# Patient Record
Sex: Female | Born: 1960 | Race: White | Hispanic: No | Marital: Married | Smoking: Never smoker
Health system: Southern US, Community
[De-identification: ages and names within clinical notes are randomized; demographics above are authoritative.]

## PROBLEM LIST (undated history)

## (undated) DIAGNOSIS — F419 Anxiety disorder, unspecified: Secondary | ICD-10-CM

## (undated) DIAGNOSIS — N209 Urinary calculus, unspecified: Secondary | ICD-10-CM

## (undated) DIAGNOSIS — T8859XA Other complications of anesthesia, initial encounter: Secondary | ICD-10-CM

## (undated) DIAGNOSIS — E7800 Pure hypercholesterolemia, unspecified: Secondary | ICD-10-CM

## (undated) DIAGNOSIS — N200 Calculus of kidney: Secondary | ICD-10-CM

## (undated) DIAGNOSIS — I10 Essential (primary) hypertension: Secondary | ICD-10-CM

## (undated) DIAGNOSIS — G4733 Obstructive sleep apnea (adult) (pediatric): Secondary | ICD-10-CM

## (undated) DIAGNOSIS — G8929 Other chronic pain: Secondary | ICD-10-CM

## (undated) DIAGNOSIS — T4145XA Adverse effect of unspecified anesthetic, initial encounter: Secondary | ICD-10-CM

## (undated) DIAGNOSIS — K219 Gastro-esophageal reflux disease without esophagitis: Secondary | ICD-10-CM

## (undated) DIAGNOSIS — M1990 Unspecified osteoarthritis, unspecified site: Secondary | ICD-10-CM

## (undated) DIAGNOSIS — N159 Renal tubulo-interstitial disease, unspecified: Secondary | ICD-10-CM

## (undated) DIAGNOSIS — F32A Depression, unspecified: Secondary | ICD-10-CM

## (undated) DIAGNOSIS — F329 Major depressive disorder, single episode, unspecified: Secondary | ICD-10-CM

## (undated) DIAGNOSIS — M549 Dorsalgia, unspecified: Secondary | ICD-10-CM

## (undated) HISTORY — PX: ELBOW SURGERY: SHX618

## (undated) HISTORY — DX: Unspecified osteoarthritis, unspecified site: M19.90

## (undated) HISTORY — DX: Other chronic pain: G89.29

## (undated) HISTORY — DX: Anxiety disorder, unspecified: F41.9

## (undated) HISTORY — PX: LAPAROSCOPIC GASTRIC BANDING: SHX1100

## (undated) HISTORY — DX: Obstructive sleep apnea (adult) (pediatric): G47.33

## (undated) HISTORY — PX: LITHOTRIPSY: SUR834

## (undated) HISTORY — DX: Urinary calculus, unspecified: N20.9

## (undated) HISTORY — PX: KNEE SURGERY: SHX244

## (undated) HISTORY — DX: Calculus of kidney: N20.0

## (undated) HISTORY — DX: Dorsalgia, unspecified: M54.9

## (undated) HISTORY — PX: ABDOMINAL HYSTERECTOMY: SHX81

---
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
## 2010-01-29 ENCOUNTER — Emergency Department (HOSPITAL_COMMUNITY): Admission: EM | Admit: 2010-01-29 | Discharge: 2010-01-29 | Payer: Self-pay | Admitting: Emergency Medicine

## 2010-10-03 ENCOUNTER — Emergency Department (HOSPITAL_COMMUNITY)
Admission: EM | Admit: 2010-10-03 | Discharge: 2010-10-03 | Disposition: A | Discharge status: Home / Self Care | Payer: Managed Care, Other (non HMO) | Attending: Emergency Medicine | Admitting: Emergency Medicine

## 2010-10-03 ENCOUNTER — Emergency Department (HOSPITAL_COMMUNITY): Payer: Managed Care, Other (non HMO)

## 2010-10-03 DIAGNOSIS — E876 Hypokalemia: Secondary | ICD-10-CM | POA: Insufficient documentation

## 2010-10-03 DIAGNOSIS — F3289 Other specified depressive episodes: Secondary | ICD-10-CM | POA: Insufficient documentation

## 2010-10-03 DIAGNOSIS — R509 Fever, unspecified: Secondary | ICD-10-CM | POA: Insufficient documentation

## 2010-10-03 DIAGNOSIS — F329 Major depressive disorder, single episode, unspecified: Secondary | ICD-10-CM | POA: Insufficient documentation

## 2010-10-03 DIAGNOSIS — R5381 Other malaise: Secondary | ICD-10-CM | POA: Insufficient documentation

## 2010-10-03 DIAGNOSIS — I10 Essential (primary) hypertension: Secondary | ICD-10-CM | POA: Insufficient documentation

## 2010-10-03 DIAGNOSIS — N390 Urinary tract infection, site not specified: Secondary | ICD-10-CM | POA: Insufficient documentation

## 2010-10-03 LAB — COMPREHENSIVE METABOLIC PANEL
ALT: 38 U/L — ABNORMAL HIGH (ref 0–35)
AST: 50 U/L — ABNORMAL HIGH (ref 0–37)
Albumin: 3.4 g/dL — ABNORMAL LOW (ref 3.5–5.2)
Alkaline Phosphatase: 84 U/L (ref 39–117)
BUN: 8 mg/dL (ref 6–23)
CO2: 30 mEq/L (ref 19–32)
Calcium: 8.9 mg/dL (ref 8.4–10.5)
Chloride: 92 mEq/L — ABNORMAL LOW (ref 96–112)
Creatinine, Ser: 0.63 mg/dL (ref 0.50–1.10)
GFR calc Af Amer: >60 mL/min (ref >60)
GFR calc non Af Amer: >60 mL/min (ref >60)
Glucose, Bld: 97 mg/dL (ref 70–99)
Potassium: 2.6 mEq/L — CL (ref 3.5–5.1)
Sodium: 132 mEq/L — ABNORMAL LOW (ref 135–145)
Total Bilirubin: 0.9 mg/dL (ref 0.3–1.2)
Total Protein: 7.5 g/dL (ref 6.0–8.3)

## 2010-10-03 LAB — URINALYSIS, ROUTINE W REFLEX MICROSCOPIC
Bilirubin Urine: NEGATIVE
Glucose, UA: NEGATIVE mg/dL
Glucose, UA: NEGATIVE mg/dL
Ketones, ur: NEGATIVE mg/dL
Nitrite: POSITIVE — AB
Nitrite: NEGATIVE
Protein, ur: 30 mg/dL — AB
Protein, ur: NEGATIVE mg/dL
Specific Gravity, Urine: 1.014 (ref 1.005–1.030)
Specific Gravity, Urine: 1.007 (ref 1.005–1.030)
Urobilinogen, UA: 2 mg/dL — ABNORMAL HIGH (ref 0.0–1.0)
Urobilinogen, UA: 1 mg/dL (ref 0.0–1.0)
pH: 6.5 (ref 5.0–8.0)
pH: 7 (ref 5.0–8.0)

## 2010-10-03 LAB — DIFFERENTIAL
Basophils Absolute: 0.1 10*3/uL (ref 0.0–0.1)
Basophils Relative: 1 % (ref 0–1)
Eosinophils Absolute: 0 10*3/uL (ref 0.0–0.7)
Eosinophils Relative: 0 % (ref 0–5)
Lymphocytes Relative: 54 % — ABNORMAL HIGH (ref 12–46)
Lymphs Abs: 4.4 10*3/uL — ABNORMAL HIGH (ref 0.7–4.0)
Monocytes Absolute: 0.3 10*3/uL (ref 0.1–1.0)
Monocytes Relative: 4 % (ref 3–12)
Neutro Abs: 3.3 10*3/uL (ref 1.7–7.7)
Neutrophils Relative %: 41 % — ABNORMAL LOW (ref 43–77)

## 2010-10-03 LAB — CBC
HCT: 36.8 % (ref 36.0–46.0)
Hemoglobin: 12.1 g/dL (ref 12.0–15.0)
MCH: 28.1 pg (ref 26.0–34.0)
MCHC: 32.9 g/dL (ref 30.0–36.0)
MCV: 85.6 fL (ref 78.0–100.0)
Platelets: 127 10*3/uL — ABNORMAL LOW (ref 150–400)
RBC: 4.3 MIL/uL (ref 3.87–5.11)
RDW: 13.6 % (ref 11.5–15.5)
WBC: 8.1 10*3/uL (ref 4.0–10.5)

## 2010-10-03 LAB — URINE MICROSCOPIC-ADD ON

## 2010-10-03 LAB — PROTIME-INR
INR: 1.15 (ref 0.00–1.49)
Prothrombin Time: 14.9 seconds (ref 11.6–15.2)

## 2010-10-03 LAB — APTT: aPTT: 32 seconds (ref 24–37)

## 2010-10-05 LAB — URINE CULTURE
Colony Count: >=100000
Culture  Setup Time: 201207291757

## 2010-10-06 LAB — URINE CULTURE
Colony Count: >=100000
Culture  Setup Time: 201207292022

## 2011-01-28 ENCOUNTER — Emergency Department (HOSPITAL_COMMUNITY)
Admission: EM | Admit: 2011-01-28 | Discharge: 2011-01-29 | Disposition: A | Discharge status: Home / Self Care | Payer: Managed Care, Other (non HMO) | Attending: Emergency Medicine | Admitting: Emergency Medicine

## 2011-01-28 ENCOUNTER — Emergency Department (HOSPITAL_COMMUNITY): Payer: Managed Care, Other (non HMO)

## 2011-01-28 ENCOUNTER — Encounter: Payer: Self-pay | Admitting: *Deleted

## 2011-01-28 DIAGNOSIS — I10 Essential (primary) hypertension: Secondary | ICD-10-CM | POA: Insufficient documentation

## 2011-01-28 DIAGNOSIS — F329 Major depressive disorder, single episode, unspecified: Secondary | ICD-10-CM | POA: Insufficient documentation

## 2011-01-28 DIAGNOSIS — Z7982 Long term (current) use of aspirin: Secondary | ICD-10-CM | POA: Insufficient documentation

## 2011-01-28 DIAGNOSIS — E7800 Pure hypercholesterolemia, unspecified: Secondary | ICD-10-CM | POA: Insufficient documentation

## 2011-01-28 DIAGNOSIS — N12 Tubulo-interstitial nephritis, not specified as acute or chronic: Secondary | ICD-10-CM

## 2011-01-28 DIAGNOSIS — Z79899 Other long term (current) drug therapy: Secondary | ICD-10-CM | POA: Insufficient documentation

## 2011-01-28 DIAGNOSIS — F3289 Other specified depressive episodes: Secondary | ICD-10-CM | POA: Insufficient documentation

## 2011-01-28 DIAGNOSIS — K219 Gastro-esophageal reflux disease without esophagitis: Secondary | ICD-10-CM | POA: Insufficient documentation

## 2011-01-28 HISTORY — DX: Pure hypercholesterolemia, unspecified: E78.00

## 2011-01-28 HISTORY — DX: Major depressive disorder, single episode, unspecified: F32.9

## 2011-01-28 HISTORY — DX: Essential (primary) hypertension: I10

## 2011-01-28 HISTORY — DX: Renal tubulo-interstitial disease, unspecified: N15.9

## 2011-01-28 HISTORY — DX: Gastro-esophageal reflux disease without esophagitis: K21.9

## 2011-01-28 HISTORY — DX: Depression, unspecified: F32.A

## 2011-01-28 LAB — URINALYSIS, ROUTINE W REFLEX MICROSCOPIC
Bilirubin Urine: NEGATIVE
Glucose, UA: NEGATIVE mg/dL
Ketones, ur: NEGATIVE mg/dL
Nitrite: NEGATIVE
Protein, ur: NEGATIVE mg/dL
Specific Gravity, Urine: 1.009 (ref 1.005–1.030)
Urobilinogen, UA: 1 mg/dL (ref 0.0–1.0)
pH: 7 (ref 5.0–8.0)

## 2011-01-28 LAB — BASIC METABOLIC PANEL
BUN: 8 mg/dL (ref 6–23)
CO2: 28 mEq/L (ref 19–32)
Calcium: 9.1 mg/dL (ref 8.4–10.5)
Chloride: 92 mEq/L — ABNORMAL LOW (ref 96–112)
Creatinine, Ser: 0.63 mg/dL (ref 0.50–1.10)
GFR calc Af Amer: >90 mL/min (ref >90)
GFR calc non Af Amer: >90 mL/min (ref >90)
Glucose, Bld: 100 mg/dL — ABNORMAL HIGH (ref 70–99)
Potassium: 3 mEq/L — ABNORMAL LOW (ref 3.5–5.1)
Sodium: 131 mEq/L — ABNORMAL LOW (ref 135–145)

## 2011-01-28 LAB — URINE MICROSCOPIC-ADD ON

## 2011-01-28 LAB — DIFFERENTIAL
Basophils Absolute: 0 10*3/uL (ref 0.0–0.1)
Basophils Relative: 0 % (ref 0–1)
Eosinophils Absolute: 0 10*3/uL (ref 0.0–0.7)
Eosinophils Relative: 1 % (ref 0–5)
Lymphocytes Relative: 40 % (ref 12–46)
Lymphs Abs: 2.7 10*3/uL (ref 0.7–4.0)
Monocytes Absolute: 0.7 10*3/uL (ref 0.1–1.0)
Monocytes Relative: 11 % (ref 3–12)
Neutro Abs: 3.2 10*3/uL (ref 1.7–7.7)
Neutrophils Relative %: 48 % (ref 43–77)

## 2011-01-28 LAB — POCT I-STAT, CHEM 8
BUN: 7 mg/dL (ref 6–23)
Calcium, Ion: 1.1 mmol/L — ABNORMAL LOW (ref 1.12–1.32)
Chloride: 95 mEq/L — ABNORMAL LOW (ref 96–112)
Creatinine, Ser: 0.8 mg/dL (ref 0.50–1.10)
Glucose, Bld: 97 mg/dL (ref 70–99)
HCT: 34 % — ABNORMAL LOW (ref 36.0–46.0)
Hemoglobin: 11.6 g/dL — ABNORMAL LOW (ref 12.0–15.0)
Potassium: 3.1 mEq/L — ABNORMAL LOW (ref 3.5–5.1)
Sodium: 136 mEq/L (ref 135–145)
TCO2: 29 mmol/L (ref 0–100)

## 2011-01-28 LAB — CBC
HCT: 35.6 % — ABNORMAL LOW (ref 36.0–46.0)
Hemoglobin: 11.8 g/dL — ABNORMAL LOW (ref 12.0–15.0)
MCH: 27.9 pg (ref 26.0–34.0)
MCHC: 33.1 g/dL (ref 30.0–36.0)
MCV: 84.2 fL (ref 78.0–100.0)
Platelets: 166 10*3/uL (ref 150–400)
RBC: 4.23 MIL/uL (ref 3.87–5.11)
RDW: 13.8 % (ref 11.5–15.5)
WBC: 6.6 10*3/uL (ref 4.0–10.5)

## 2011-01-28 MED ORDER — POTASSIUM CHLORIDE CRYS ER 20 MEQ PO TBCR
40.0000 meq | EXTENDED_RELEASE_TABLET | Freq: Once | ORAL | Status: AC
  Administered 2011-01-28: 40 meq via ORAL
  Filled 2011-01-28: qty 2

## 2011-01-28 MED ORDER — ONDANSETRON HCL 4 MG/2ML IJ SOLN
4.0000 mg | Freq: Once | INTRAMUSCULAR | Status: AC
  Administered 2011-01-28: 4 mg via INTRAVENOUS
  Filled 2011-01-28: qty 2

## 2011-01-28 MED ORDER — SODIUM CHLORIDE 0.9 % IV SOLN
20.0000 mL | INTRAVENOUS | Status: DC
  Administered 2011-01-28: 20 mL via INTRAVENOUS

## 2011-01-28 MED ORDER — SODIUM CHLORIDE 0.9 % IV BOLUS (SEPSIS)
1000.0000 mL | Freq: Once | INTRAVENOUS | Status: AC
  Administered 2011-01-28: 1000 mL via INTRAVENOUS

## 2011-01-28 MED ORDER — MORPHINE SULFATE 2 MG/ML IJ SOLN
2.0000 mg | Freq: Once | INTRAMUSCULAR | Status: AC
  Administered 2011-01-28: 2 mg via INTRAVENOUS
  Filled 2011-01-28: qty 1

## 2011-01-28 MED ORDER — DEXTROSE 5 % IV SOLN
1.0000 g | Freq: Once | INTRAVENOUS | Status: AC
  Administered 2011-01-28: 1 g via INTRAVENOUS
  Filled 2011-01-28: qty 10

## 2011-01-28 MED ORDER — PROMETHAZINE HCL 25 MG PO TABS: 25.0000 mg | ORAL_TABLET | Freq: Four times a day (QID) | ORAL | Status: AC | PRN

## 2011-01-28 MED ORDER — CIPROFLOXACIN HCL 500 MG PO TABS: 500.0000 mg | ORAL_TABLET | Freq: Two times a day (BID) | ORAL | Status: AC

## 2011-01-28 NOTE — ED Provider Notes (Signed)
History     CSN: [id number] Arrival date & time: 01/28/2011  7:49 PM   First MD Initiated Contact with Patient 01/28/11 2240      Chief Complaint  Patient presents with  . Weakness  . Headache  . Nausea  . Palpitations    hx of hypokalemia, feels similar now    HPI  History provided by the patient. Patient presents with complaints of feeling generalized fatigue, chills, weakness, nausea and vomiting that began 2 days ago. She also states that 2 days ago she had intense left flank pain similar to prior kidney stone pain. She reports history of multiple kidney stones in the past. Pain has since left and patient thought that maybe she had passed a kidney stone but then began to fill ill. Patient also had a history of pyelonephritis in the past and states that symptoms today feel similar to that. He reports a fever of 100.8 Fahrenheit at home. She has been taking over-the-counter pain meds for symptoms. Pt has no other significant PMHx.    Past Medical History  Diagnosis Date  . Kidney infection   . Hypertension   . Hypercholesteremia   . Acid reflux   . Depressed     Past Surgical History  Procedure Date  . Abdominal hysterectomy     History reviewed. No pertinent family history.  History  Substance Use Topics  . Smoking status: Never Smoker   . Smokeless tobacco: Not on file  . Alcohol Use: No    OB History    Grav Para Term Preterm Abortions TAB SAB Ect Mult Living                  Review of Systems  Constitutional: Positive for fever, chills, activity change, appetite change and fatigue.  Respiratory: Negative for cough and shortness of breath.   Cardiovascular: Positive for palpitations. Negative for chest pain.  Gastrointestinal: Positive for nausea and vomiting. Negative for abdominal pain.  Genitourinary: Positive for dysuria and frequency. Negative for hematuria and flank pain.  Neurological: Positive for weakness.  All other systems reviewed and are  negative.    Allergies  Review of patient's allergies indicates no known allergies.  Home Medications   Current Outpatient Rx  Name Route Sig Dispense Refill  . ASPIRIN 81 MG PO TABS Oral Take 81 mg by mouth daily.      . DULOXETINE HCL 60 MG PO CPEP Oral Take 60 mg by mouth daily.      Marland Kitchen ESOMEPRAZOLE MAGNESIUM 40 MG PO CPDR Oral Take by mouth daily before breakfast.      . HYDROCHLOROTHIAZIDE 25 MG PO TABS Oral Take 25 mg by mouth daily.      Marland Kitchen ONE-DAILY MULTI VITAMINS PO TABS Oral Take 1 tablet by mouth daily.      Marland Kitchen SIMVASTATIN 40 MG PO TABS Oral Take 40 mg by mouth at bedtime.        BP 107/74  Pulse 88  Temp(Src) 99 F (37.2 C) (Oral)  Resp 20  SpO2 97%  Physical Exam  Nursing note and vitals reviewed. Constitutional: She is oriented to person, place, and time. She appears well-developed and well-nourished. No distress.  HENT:  Head: Normocephalic and atraumatic.  Cardiovascular: Normal rate, regular rhythm and normal heart sounds.   Pulmonary/Chest: Effort normal and breath sounds normal. She has no wheezes. She has no rales.  Abdominal: Soft. Bowel sounds are normal. She exhibits no distension. There is no tenderness. There is no  rebound, no guarding and no CVA tenderness.  Neurological: She is alert and oriented to person, place, and time.  Skin: Skin is warm and dry.  Psychiatric: She has a normal mood and affect. Her behavior is normal.    ED Course  Procedures (including critical care time)  Labs Reviewed  URINALYSIS, ROUTINE W REFLEX MICROSCOPIC - Abnormal; Notable for the following:    Appearance CLOUDY (*)    Hgb urine dipstick TRACE (*)    Leukocytes, UA LARGE (*)    All other components within normal limits  CBC - Abnormal; Notable for the following:    Hemoglobin 11.8 (*)    HCT 35.6 (*)    All other components within normal limits  BASIC METABOLIC PANEL - Abnormal; Notable for the following:    Sodium 131 (*)    Potassium 3.0 (*)    Chloride 92  (*)    Glucose, Bld 100 (*)    All other components within normal limits  URINE MICROSCOPIC-ADD ON - Abnormal; Notable for the following:    Bacteria, UA FEW (*)    All other components within normal limits  POCT I-STAT, CHEM 8 - Abnormal; Notable for the following:    Potassium 3.1 (*)    Chloride 95 (*)    Calcium, Ion 1.10 (*)    Hemoglobin 11.6 (*)    HCT 34.0 (*)    All other components within normal limits  DIFFERENTIAL  I-STAT, CHEM 8   Results for orders placed during the hospital encounter of 01/28/11  URINALYSIS, ROUTINE W REFLEX MICROSCOPIC      Component Value Range   Color, Urine YELLOW  YELLOW    Appearance CLOUDY (*) CLEAR    Specific Gravity, Urine 1.009  1.005 - 1.030    pH 7.0  5.0 - 8.0    Glucose, UA NEGATIVE  NEGATIVE (mg/dL)   Hgb urine dipstick TRACE (*) NEGATIVE    Bilirubin Urine NEGATIVE  NEGATIVE    Ketones, ur NEGATIVE  NEGATIVE (mg/dL)   Protein, ur NEGATIVE  NEGATIVE (mg/dL)   Urobilinogen, UA 1.0  0.0 - 1.0 (mg/dL)   Nitrite NEGATIVE  NEGATIVE    Leukocytes, UA LARGE (*) NEGATIVE   CBC      Component Value Range   WBC 6.6  4.0 - 10.5 (K/uL)   RBC 4.23  3.87 - 5.11 (MIL/uL)   Hemoglobin 11.8 (*) 12.0 - 15.0 (g/dL)   HCT 78.4 (*) 69.6 - 46.0 (%)   MCV 84.2  78.0 - 100.0 (fL)   MCH 27.9  26.0 - 34.0 (pg)   MCHC 33.1  30.0 - 36.0 (g/dL)   RDW 29.5  28.4 - 13.2 (%)   Platelets 166  150 - 400 (K/uL)  DIFFERENTIAL      Component Value Range   Neutrophils Relative 48  43 - 77 (%)   Neutro Abs 3.2  1.7 - 7.7 (K/uL)   Lymphocytes Relative 40  12 - 46 (%)   Lymphs Abs 2.7  0.7 - 4.0 (K/uL)   Monocytes Relative 11  3 - 12 (%)   Monocytes Absolute 0.7  0.1 - 1.0 (K/uL)   Eosinophils Relative 1  0 - 5 (%)   Eosinophils Absolute 0.0  0.0 - 0.7 (K/uL)   Basophils Relative 0  0 - 1 (%)   Basophils Absolute 0.0  0.0 - 0.1 (K/uL)  BASIC METABOLIC PANEL      Component Value Range   Sodium 131 (*) 135 - 145 (  mEq/L)   Potassium 3.0 (*) 3.5 - 5.1  (mEq/L)   Chloride 92 (*) 96 - 112 (mEq/L)   CO2 28  19 - 32 (mEq/L)   Glucose, Bld 100 (*) 70 - 99 (mg/dL)   BUN 8  6 - 23 (mg/dL)   Creatinine, Ser 1.61  0.50 - 1.10 (mg/dL)   Calcium 9.1  8.4 - 09.6 (mg/dL)   GFR calc non Af Amer >90  >90 (mL/min)   GFR calc Af Amer >90  >90 (mL/min)  URINE MICROSCOPIC-ADD ON      Component Value Range   Squamous Epithelial / LPF RARE  RARE    WBC, UA 11-20  <3 (WBC/hpf)   Bacteria, UA FEW (*) RARE   POCT I-STAT, CHEM 8      Component Value Range   Sodium 136  135 - 145 (mEq/L)   Potassium 3.1 (*) 3.5 - 5.1 (mEq/L)   Chloride 95 (*) 96 - 112 (mEq/L)   BUN 7  6 - 23 (mg/dL)   Creatinine, Ser 0.45  0.50 - 1.10 (mg/dL)   Glucose, Bld 97  70 - 99 (mg/dL)   Calcium, Ion 4.09 (*) 1.12 - 1.32 (mmol/L)   TCO2 29  0 - 100 (mmol/L)   Hemoglobin 11.6 (*) 12.0 - 15.0 (g/dL)   HCT 81.1 (*) 91.4 - 46.0 (%)     Ct Abdomen Pelvis Wo Contrast  01/28/2011  *RADIOLOGY REPORT*  Clinical Data: Right-sided abdominal pain; history of pyelonephritis and renal stones.  CT ABDOMEN AND PELVIS WITHOUT CONTRAST  Technique:  Multidetector CT imaging of the abdomen and pelvis was performed following the standard protocol without intravenous contrast.  Comparison: None.  Findings: The visualized lung bases are clear, aside from a small calcified granuloma at the right lung base.  There is diffuse fatty infiltration within the liver, with areas of regional sparing.  The spleen is unremarkable in appearance.  The gallbladder is within normal limits.  The pancreas and adrenal glands are grossly unremarkable.  There is significant bilateral renal scarring and atrophy, with chronic distortion of renal configuration. Scattered bilateral renal stones are seen, measuring up to 0.7 cm in size, without evidence of obstruction.  No hydronephrosis is seen.  On the left side, there is asymmetric perinephric stranding and trace stranding about Gerota's fascia, raising question for left- sided  pyelonephritis.  No definite acute process is seen with respect to the right kidney.  No free fluid is identified.  The small bowel is unremarkable in appearance.  The stomach is within normal limits; the patient is status post gastric banding.  The gastric band is noted at a 28 degree angle to the horizontal, slightly more horizontal than typically seen.  Suggest clinical correlation for symptoms with respect to the gastric band.  The gastric band tubing and port are unremarkable in appearance.  No acute vascular abnormalities are seen.  The appendix is decompressed and is grossly unremarkable in appearance.  The prior colon is within normal limits.  The bladder is mildly distended and unremarkable in appearance. The patient is status post hysterectomy; no suspicious adnexal masses are seen.  The ovaries are grossly unremarkable in appearance.  No inguinal lymphadenopathy is seen.  No acute osseous abnormalities are identified.  IMPRESSION:  1.  Asymmetric perinephric stranding and trace stranding about Gerota's fascia on the left side, raising question for left-sided pyelonephritis.  No definite acute process seen with respect to the right kidney. 2.  Significant bilateral renal scarring and atrophy,  with chronic distortion of underlying renal configuration.  Scattered bilateral renal stones, measuring up to 0.7 cm in size, without evidence of hydronephrosis. 3.  Gastric band noted at a 28 degree angle to the horizontal, slightly more horizontal than typically seen.  Suggest clinical correlation for symptoms with respect to the gastric band.  Gastric banding otherwise unremarkable in appearance.  Original Report Authenticated By: Tonia Ghent, M.D.     1. Pyelonephritis     MDM  10:40 PM patient seen and evaluated. Patient in no acute distress.  Pain improved after pain meds. CT with signs for pyelo. No kidney stones seen. Dose of Rocephin given. Pt stable for continued outpt tx. She will follow with  her urologist.         Angus Seller, PA 01/29/11 [phone]

## 2011-01-28 NOTE — ED Notes (Signed)
Pt states that she has had a medical hx of kidney stones in the past and felt flank pain all day Wednesday and all day Thursday. Patient states that the flank pain is now gone but she is now experiencing fever, headache, chills, overall fatigue and weakness. Nausea and vomiting x 1 today when she tried to eat earlier. Patient states that this feels like when she was diagnosed with pyelonephritis.

## 2011-01-28 NOTE — ED Notes (Signed)
Pt c/o recent hx of kidney stone pain starting on past Wed. Pt c/o headache, weakness, on-going pain and nausea. Pt has past hx of pylonephritis.

## 2011-01-28 NOTE — ED Notes (Signed)
Hx kidney stones, "carries them all the time".  Wednesday had flank pain.  Hx of hypokalemia, having palpitations, weakness.

## 2011-01-29 NOTE — ED Notes (Signed)
Patient given discharge instructions, information, prescriptions, and diet order. Patient states that they adequately understand discharge information given and to return to ED if symptoms return or worsen.     

## 2011-01-30 NOTE — ED Provider Notes (Signed)
Medical screening examination/treatment/procedure(s) were performed by non-physician practitioner and as supervising physician I was immediately available for consultation/collaboration.    Meredeth Furber L Lizbeth Feijoo, MD 01/30/11 0618 

## 2011-02-15 ENCOUNTER — Ambulatory Visit: Payer: Managed Care, Other (non HMO) | Admitting: Physical Medicine & Rehabilitation

## 2011-03-03 ENCOUNTER — Encounter: Payer: Managed Care, Other (non HMO) | Attending: Physical Medicine & Rehabilitation

## 2011-03-03 ENCOUNTER — Ambulatory Visit: Payer: Managed Care, Other (non HMO) | Admitting: Physical Medicine & Rehabilitation

## 2011-03-03 DIAGNOSIS — M5126 Other intervertebral disc displacement, lumbar region: Secondary | ICD-10-CM | POA: Insufficient documentation

## 2011-03-03 DIAGNOSIS — M79609 Pain in unspecified limb: Secondary | ICD-10-CM | POA: Insufficient documentation

## 2011-03-03 DIAGNOSIS — M25559 Pain in unspecified hip: Secondary | ICD-10-CM | POA: Insufficient documentation

## 2011-03-03 DIAGNOSIS — M545 Low back pain, unspecified: Secondary | ICD-10-CM | POA: Insufficient documentation

## 2011-03-03 DIAGNOSIS — IMO0001 Reserved for inherently not codable concepts without codable children: Secondary | ICD-10-CM | POA: Insufficient documentation

## 2011-03-03 NOTE — Consult Note (Signed)
This is a consultation for acupuncture.  CHIEF COMPLAINT:  Right hip and buttock as well as right posterior thigh pain.  A 50 year old female with onset of pain in this area about 5 years ago. She has undergone evaluation in [place] prior to coming to [place].  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 She has tried chiropractic as well as series of 3 epidural injections, but these were not helpful.  She is taking oxycodone which gives her some relief.  She thinks she takes it about 3 times a day, but sometimes less.  She has tried TENs unit which was not helpful.  She does some stretching which does seem to help her.  She has gone through physical therapy several times as well.  Medications include oxycodone 5 mg t.i.d., Cymbalta, hydrochlorothiazide, simvastatin, and Nexium.  Pain level is 4 to 6 out of 10, described as dull and aching, increases with sitting.  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  REVIEW OF SYSTEMS:  Positive for tremor, some sleep issues.  SOCIAL HISTORY:  Widowed, is here with her new fiance.  FAMILY HISTORY:  Heart disease, lung disease, hypertension.  PHYSICAL EXAMINATION:  VITAL SIGNS:  Weight 183 pounds, height 5 feet, 5 inches. GENERAL:  A well-developed overweight female, in no acute distress. Mood and affect are appropriate. MUSCULOSKELETAL:  Her lumbar spine range of motion is normal forward flexion, extension, lateral rotation, and bending.  Lower extremity strength is normal.  Hip range of motion is normal.  Deep tendon reflexes are normal.  Sensation normal in the lower extremities.  She has no tenderness to palpation in the lumbar spine or the thoracic spine area.  IMPRESSION:  Lumbar pain appears to be disk related exacerbated by prolonged sitting.  We discussed acupuncture that it would require several visits to see whether it actually would be  helpful.  She is not sure whether insurance covers it.  We discussed either having treatments here versus at another facility depending on her insurance coverage.  In addition, we talked about the usual amount of pain relief as well as the need for ongoing treatments given her chronic situation.  Needles placed at bilateral BL 23, BL 24, as well as bilateral BL 52, and at DU 4 at midline, 2 Hz stimulation 10 minutes trial.  The patient tolerated procedure well.     Erick Colace, M.D. Electronically Signed    AEK/MedQ D:03/03/2011 09:35:47  T:03/03/2011 19:36:40  Job #:  [id number]

## 2012-01-13 ENCOUNTER — Encounter (INDEPENDENT_AMBULATORY_CARE_PROVIDER_SITE_OTHER): Payer: Self-pay | Admitting: General Surgery

## 2012-01-26 ENCOUNTER — Encounter (INDEPENDENT_AMBULATORY_CARE_PROVIDER_SITE_OTHER): Payer: Self-pay | Admitting: Surgery

## 2012-01-26 ENCOUNTER — Ambulatory Visit (INDEPENDENT_AMBULATORY_CARE_PROVIDER_SITE_OTHER): Payer: Managed Care, Other (non HMO) | Admitting: Surgery

## 2012-01-26 VITALS — BP 128/82 | HR 76 | Temp 98.4°F | Resp 20 | Ht 65.0 in | Wt 185.0 lb

## 2012-01-26 DIAGNOSIS — Z9884 Bariatric surgery status: Secondary | ICD-10-CM

## 2012-01-26 NOTE — Patient Instructions (Signed)

## 2012-01-26 NOTE — Progress Notes (Signed)
Chief Complaint:  10 cm Lapband placed in [place] in 2007  History of Present Illness:  Tracey Powell is an 51 y.o. female who had a lapband 6 years ago.  In short, she is too tight and has developed maladaptive behavior and she needs and wants to lose 30 more lbs.  Her daughter is a Scientific laboratory technician at [employer].  I am seeing her to accept the transfer of care.    Past Medical History  Diagnosis Date  . Kidney infection   . Hypertension   . Hypercholesteremia   . Acid reflux   . Depressed   . Urolithiasis   . Nephrolithiasis     Past Surgical History  Procedure Date  . Abdominal hysterectomy     Current Outpatient Prescriptions  Medication Sig Dispense Refill  . DULoxetine (CYMBALTA) 60 MG capsule Take 60 mg by mouth daily.        . Multiple Vitamin (MULTIVITAMIN) tablet Take 1 tablet by mouth daily.        . Oxycodone HCl 10 MG TABS       . simvastatin (ZOCOR) 40 MG tablet Take 40 mg by mouth at bedtime.        Marland Kitchen aspirin 81 MG tablet Take 81 mg by mouth daily.        Marland Kitchen esomeprazole (NEXIUM) 40 MG capsule Take by mouth daily before breakfast.        . hydrochlorothiazide (HYDRODIURIL) 25 MG tablet Take 25 mg by mouth daily.        Marland Kitchen omeprazole (PRILOSEC) 10 MG capsule Take 10 mg by mouth daily.      Marland Kitchen sulfamethoxazole-trimethoprim (BACTRIM DS) 800-160 MG per tablet Take 1 tablet by mouth 2 (two) times daily.       Review of patient's allergies indicates no known allergies. No family history on file. Social History:   reports that she has never smoked. She does not have any smokeless tobacco history on file. She reports that she does not drink alcohol or use illicit drugs.   REVIEW OF SYSTEMS - PERTINENT POSITIVES ONLY: Non contributory  Physical Exam:   Blood pressure 128/82, pulse 76, temperature 98.4 F (36.9 C), temperature source Oral, resp. rate 20, height 5\' 5"  (1.651 m), weight 185 lb (83.915 kg). Body mass index is 30.79 kg/(m^2).  Gen:  WDWN WF NAD    Neurological: Alert and oriented to person, place, and time. Motor and sensory function is grossly intact  Head: Normocephalic and atraumatic.  Eyes: Conjunctivae are normal. Pupils are equal, round, and reactive to light. No scleral icterus.  Neck: Normal range of motion. Neck supple. No tracheal deviation or thyromegaly present.  Cardiovascular:  SR without murmurs or gallops.  No carotid bruits Respiratory: Effort normal.  No respiratory distress. No chest wall tenderness. Breath sounds normal.  No wheezes, rales or rhonchi.  Abdomen:  Band port in the right.  Easily accessed and 0.5 cc removed GU: Musculoskeletal: Normal range of motion. Extremities are nontender. No cyanosis, edema or clubbing noted Lymphadenopathy: No cervical, preauricular, postauricular or axillary adenopathy is present Skin: Skin is warm and dry. No rash noted. No diaphoresis. No erythema. No pallor. Pscyh: Normal mood and affect. Behavior is normal. Judgment and thought content normal.   LABORATORY RESULTS: No results found for this or any previous visit (from the past 48 hour(s)).  RADIOLOGY RESULTS: No results found.  Problem List: There is no problem list on file for this patient.   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     Matt B. Daphine Deutscher, MD, [facility] [phone] beeper [phone]  01/26/2012 11:43 AM

## 2012-03-13 ENCOUNTER — Encounter (INDEPENDENT_AMBULATORY_CARE_PROVIDER_SITE_OTHER): Payer: Managed Care, Other (non HMO) | Admitting: Surgery

## 2012-03-29 ENCOUNTER — Ambulatory Visit (INDEPENDENT_AMBULATORY_CARE_PROVIDER_SITE_OTHER): Payer: Managed Care, Other (non HMO) | Admitting: Physician Assistant

## 2012-03-29 ENCOUNTER — Encounter (INDEPENDENT_AMBULATORY_CARE_PROVIDER_SITE_OTHER): Payer: Self-pay

## 2012-03-29 DIAGNOSIS — Z9884 Bariatric surgery status: Secondary | ICD-10-CM

## 2012-03-29 NOTE — Patient Instructions (Signed)
Return in March. Focus on good food choices as well as physical activity. Return sooner if you have an increase in hunger, portion sizes or weight. Return also for difficulty swallowing, night cough, reflux.

## 2012-03-29 NOTE — Progress Notes (Signed)
  HISTORY: Tracey Powell is a 52 y.o.female who received an 10cm lap-band in 2007 in [place]. She was last seen in November by Dr. Daphine Deutscher for fluid removal. He took out 0.5 mL giving her a total volume of 1.4 mL. She had maladaptive eating due to solid food dysphagia prior to this. Since the downward adjustment, she's had no further symptoms and is able to eat nutritious foods without difficulty. She describes her hunger as being under good control and her portion sizes are reasonable. She attributes the 10 lbs of weight gain since seeing Dr. Daphine Deutscher as due to the holidays and making poor food choices. She's also had some issues with back pain for which she's beginning a week of prednisone. She's aware that this may cause increased hunger and weight gain.  VITAL SIGNS: Filed Vitals:   03/29/12 1137  BP: 122/80  Pulse: 74  Temp: 96.8 F (36 C)  Resp: 16    PHYSICAL EXAM: Physical exam reveals a very well-appearing 52 y.o.female in no apparent distress Neurologic: Awake, alert, oriented Psych: Bright affect, conversant Respiratory: Breathing even and unlabored. No stridor or wheezing Extremities: Atraumatic, good range of motion. Skin: Warm, Dry, no rashes Musculoskeletal: Normal gait, Joints normal  ASSESMENT: 52 y.o.  female  s/p 10cm lap-band.   PLAN: She did not feel an adjustment was needed today, primarily because of the risk of over-restriction. We talked about food choices and how this may help. She wants to leave the fill level as-is then return to see [place] in about six weeks.

## 2012-05-24 ENCOUNTER — Encounter (INDEPENDENT_AMBULATORY_CARE_PROVIDER_SITE_OTHER): Payer: Managed Care, Other (non HMO)

## 2012-06-14 ENCOUNTER — Encounter (INDEPENDENT_AMBULATORY_CARE_PROVIDER_SITE_OTHER): Payer: Managed Care, Other (non HMO)

## 2013-02-16 ENCOUNTER — Encounter: Payer: Self-pay | Admitting: Cardiology

## 2013-02-16 ENCOUNTER — Encounter: Payer: Self-pay | Admitting: *Deleted

## 2013-02-16 DIAGNOSIS — N159 Renal tubulo-interstitial disease, unspecified: Secondary | ICD-10-CM | POA: Insufficient documentation

## 2013-02-16 DIAGNOSIS — N200 Calculus of kidney: Secondary | ICD-10-CM | POA: Insufficient documentation

## 2013-02-16 DIAGNOSIS — I10 Essential (primary) hypertension: Secondary | ICD-10-CM | POA: Insufficient documentation

## 2013-02-16 DIAGNOSIS — E7800 Pure hypercholesterolemia, unspecified: Secondary | ICD-10-CM | POA: Insufficient documentation

## 2013-02-16 DIAGNOSIS — F329 Major depressive disorder, single episode, unspecified: Secondary | ICD-10-CM | POA: Insufficient documentation

## 2013-02-16 DIAGNOSIS — N209 Urinary calculus, unspecified: Secondary | ICD-10-CM | POA: Insufficient documentation

## 2013-02-16 DIAGNOSIS — F32A Depression, unspecified: Secondary | ICD-10-CM | POA: Insufficient documentation

## 2013-02-16 DIAGNOSIS — K219 Gastro-esophageal reflux disease without esophagitis: Secondary | ICD-10-CM | POA: Insufficient documentation

## 2013-02-18 ENCOUNTER — Ambulatory Visit: Payer: Managed Care, Other (non HMO) | Admitting: Cardiology

## 2013-02-18 ENCOUNTER — Ambulatory Visit (INDEPENDENT_AMBULATORY_CARE_PROVIDER_SITE_OTHER): Payer: Managed Care, Other (non HMO) | Admitting: Cardiology

## 2013-02-18 ENCOUNTER — Encounter: Payer: Self-pay | Admitting: Cardiology

## 2013-02-18 VITALS — BP 143/91 | HR 80 | Ht 65.0 in | Wt 199.0 lb

## 2013-02-18 DIAGNOSIS — I10 Essential (primary) hypertension: Secondary | ICD-10-CM

## 2013-02-18 DIAGNOSIS — G4733 Obstructive sleep apnea (adult) (pediatric): Secondary | ICD-10-CM | POA: Insufficient documentation

## 2013-02-18 DIAGNOSIS — E669 Obesity, unspecified: Secondary | ICD-10-CM

## 2013-02-18 NOTE — Patient Instructions (Addendum)
Your physician recommends that you continue on your current medications as directed. Please refer to the Current Medication list given to you today.  Your physician has requested that you regularly monitor and record your blood pressure readings at home. Please use the same machine at the same time of day to check your readings and record them for one week and call us with the results  Please bring your card to [facility] to [facility] and send [place] the results. Our fax number should [employer] need it is [phone]  Your physician wants you to follow-up in: 6 Months with Dr Sherlyn Lick will receive a reminder letter in the mail two months in advance. If you don't receive a letter, please call our office to schedule the follow-up appointment.

## 2013-02-18 NOTE — Progress Notes (Signed)
[address] [place], [place]  [address] Phone: [phone] Fax:  [phone]  Date:  02/18/2013   ID:  Tracey Powell, DOB 27-Dec-1960, MRN [id number]  PCP:  Allean Found, MD  Sleep Medicine:  Armanda Magic, MD    History of Present Illness: Tracey Powell is a 52 y.o. female with a history of OSA and obesity.  She is doing well today.  She tolerates her CPAP device without difficulty.  She tolerates the nasal mask with chin strap and feels the pressure is adequate.  She feels rested in the am if she sleeps well the night before has no daytime sleepiness.  She does not get any aerobic execise   Wt Readings from Last 3 Encounters:  02/18/13 199 lb (90.266 kg)  03/29/12 194 lb 9.6 oz (88.27 kg)  01/26/12 185 lb (83.915 kg)     Past Medical History  Diagnosis Date  . Kidney infection   . Hypertension   . Hypercholesteremia   . Acid reflux   . Depressed   . Urolithiasis   . Nephrolithiasis   . Chronic back pain   . Anxiety   . DJD (degenerative joint disease)   . OSA (obstructive sleep apnea)     mild with AHI 13/hr now on CPAP at 11cm H2O    Current Outpatient Prescriptions  Medication Sig Dispense Refill  . DULoxetine (CYMBALTA) 60 MG capsule Take 60 mg by mouth daily.        . Multiple Vitamin (MULTIVITAMIN) tablet Take 1 tablet by mouth daily.        Marland Kitchen omeprazole (PRILOSEC) 10 MG capsule Take 10 mg by mouth daily.      . Oxycodone HCl 10 MG TABS       . predniSONE (DELTASONE) 50 MG tablet Take 50 mg by mouth daily. 1 tablet X 7 days. Started 03/28/2012.      . simvastatin (ZOCOR) 40 MG tablet Take 40 mg by mouth at bedtime.         No current facility-administered medications for this visit.    Allergies:   No Known Allergies  Social History:  The patient  reports that she has never smoked. She does not have any smokeless tobacco history on file. She reports that she does not drink alcohol or use illicit drugs.   Family History:  The  patient's family history includes CAD in her brother and father; Stroke in her mother.   ROS:  Please see the history of present illness.      All other systems reviewed and negative.   PHYSICAL EXAM: VS:  BP 143/91  Pulse 80  Ht 5\' 5"  (1.651 m)  Wt 199 lb (90.266 kg)  BMI 33.12 kg/m2 Well nourished, well developed, in no acute distress HEENT: normal Neck: no JVD Cardiac:  normal S1, S2; RRR; no murmur Lungs:  clear to auscultation bilaterally, no wheezing, rhonchi or rales Abd: soft, nontender, no hepatomegaly Ext: no edema Skin: warm and dry Neuro:  CNs 2-12 intact, no focal abnormalities noted       ASSESSMENT AND PLAN:  1. OSA on CPAP and tolerating well  - She will drop off her download card to her DME 2. Obesity - I have encouraged her to try to get in to an aerobic exercise program 3. Elevated BP - I have asked her to check her BP daily for a week and call with the results  Followup with me in 6 months  Signed, Armanda Magic,  MD 02/18/2013 9:07 AM

## 2013-03-08 ENCOUNTER — Encounter: Payer: Self-pay | Admitting: Cardiology

## 2013-04-02 ENCOUNTER — Encounter: Payer: Self-pay | Admitting: Cardiology

## 2013-09-24 ENCOUNTER — Telehealth: Payer: Self-pay | Admitting: Cardiology

## 2013-09-24 NOTE — Telephone Encounter (Signed)
Received request from Nurse fax box, documents faxed for surgical clearance. To: [facility] Fax number: [phone] Attention: 7.21.15/km

## 2013-12-03 ENCOUNTER — Ambulatory Visit (INDEPENDENT_AMBULATORY_CARE_PROVIDER_SITE_OTHER): Payer: Managed Care, Other (non HMO) | Admitting: Cardiology

## 2013-12-03 VITALS — BP 120/80 | HR 89 | Ht 65.0 in | Wt 204.0 lb

## 2013-12-03 DIAGNOSIS — G4733 Obstructive sleep apnea (adult) (pediatric): Secondary | ICD-10-CM

## 2013-12-03 DIAGNOSIS — E669 Obesity, unspecified: Secondary | ICD-10-CM

## 2013-12-03 DIAGNOSIS — I10 Essential (primary) hypertension: Secondary | ICD-10-CM

## 2013-12-03 NOTE — Patient Instructions (Signed)
Your physician recommends that you continue on your current medications as directed. Please refer to the Current Medication list given to you today.  We are going to send a message to [employer] to increase your pressure on your CPAP machine to 15 CM H20 and we will get a download 4 weeks after starting the new pressure.  Your physician wants you to follow-up in: 6 months with Dr Sherlyn Lickurner You will receive a reminder letter in the mail two months in advance. If you don't receive a letter, please call our office to schedule the follow-up appointment.

## 2013-12-03 NOTE — Progress Notes (Signed)
[address] [place], [place]  [address] Phone: [phone] Fax:  [phone]  Date:  12/03/2013   ID:  Tracey Powell, DOB February 09, 1961, MRN [id number]  PCP:  Allean FoundSMITH,CANDACE THIELE, MD  Cardiologist:  Armanda Magicraci Turner, MD    History of Present Illness: Tracey Powell is a 53 y.o. female with a history of OSA and obesity. She is doing well today. She tolerates her CPAP device without difficulty. She tolerates the nasal mask with chin strap and feels the pressure is adequate. She feels rested in the am if she sleeps well the night before has no daytime sleepiness. She does not get any aerobic execise currently and is having surgery on her rotator cuff on Monday and then plans to get into an exercise program after recovery.    Wt Readings from Last 3 Encounters:  02/18/13 199 lb (90.266 kg)  03/29/12 194 lb 9.6 oz (88.27 kg)  01/26/12 185 lb (83.915 kg)     Past Medical History  Diagnosis Date  . Kidney infection   . Hypertension   . Hypercholesteremia   . Acid reflux   . Depressed   . Urolithiasis   . Nephrolithiasis   . Chronic back pain   . Anxiety   . DJD (degenerative joint disease)   . OSA (obstructive sleep apnea)     mild with AHI 13/hr now on CPAP at 11cm H2O    Current Outpatient Prescriptions  Medication Sig Dispense Refill  . DULoxetine (CYMBALTA) 60 MG capsule Take 60 mg by mouth daily.        Providence Lanius. Krill Oil Ultra Strength 1500 MG CAPS Take 1 capsule by mouth daily.      . Multiple Vitamin (MULTIVITAMIN) tablet Take 1 tablet by mouth daily.        Marland Kitchen. omeprazole (PRILOSEC) 10 MG capsule Take 10 mg by mouth daily.      . Oxycodone HCl 10 MG TABS       . simvastatin (ZOCOR) 40 MG tablet Take 40 mg by mouth at bedtime.         No current facility-administered medications for this visit.    Allergies:   No Known Allergies  Social History:  The patient  reports that she has never smoked. She does not have any smokeless tobacco history on file. She reports  that she does not drink alcohol or use illicit drugs.   Family History:  The patient's family history includes CAD in her brother and father; Stroke in her mother.   ROS:  Please see the history of present illness.      All other systems reviewed and negative.   PHYSICAL EXAM: VS:  Ht 5\' 5"  (1.651 m) Well nourished, well developed, in no acute distress HEENT: normal Neck: no JVD Cardiac:  normal S1, S2; RRR; no murmur Lungs:  clear to auscultation bilaterally, no wheezing, rhonchi or rales Abd: soft, nontender, no hepatomegaly Ext: no edema Skin: warm and dry Neuro:  CNs 2-12 intact, no focal abnormalities noted  ASSESSMENT AND PLAN:  1. OSA on CPAP and tolerating well - Her d/l today showed an AHI of 7.2/hr on 14cm H2O and 86% compliance in using more than 4 hours nightly. I will increase her CPAP to 15cm H2O and get a d/l in 4 weeks 2. Obesity - I have encouraged her to try to get in to an aerobic exercise program 3. Elevated BP - resolved this OV  Followup with me in 6 months  Signed, Armanda Magic, MD [facility] 12/03/2013 4:48 PM

## 2013-12-10 ENCOUNTER — Encounter: Payer: Self-pay | Admitting: Cardiology

## 2014-03-17 ENCOUNTER — Encounter: Payer: Self-pay | Admitting: Cardiology

## 2014-04-16 ENCOUNTER — Encounter: Payer: Self-pay | Admitting: Cardiology

## 2014-05-13 ENCOUNTER — Encounter: Payer: Self-pay | Admitting: Cardiology

## 2015-06-20 ENCOUNTER — Encounter (HOSPITAL_COMMUNITY): Payer: Self-pay

## 2015-06-20 ENCOUNTER — Emergency Department (HOSPITAL_COMMUNITY)
Admission: EM | Admit: 2015-06-20 | Discharge: 2015-06-20 | Disposition: A | Discharge status: Home / Self Care | Payer: Managed Care, Other (non HMO) | Attending: Emergency Medicine | Admitting: Emergency Medicine

## 2015-06-20 DIAGNOSIS — G4733 Obstructive sleep apnea (adult) (pediatric): Secondary | ICD-10-CM | POA: Insufficient documentation

## 2015-06-20 DIAGNOSIS — N390 Urinary tract infection, site not specified: Secondary | ICD-10-CM | POA: Diagnosis not present

## 2015-06-20 DIAGNOSIS — I10 Essential (primary) hypertension: Secondary | ICD-10-CM | POA: Diagnosis not present

## 2015-06-20 DIAGNOSIS — E7800 Pure hypercholesterolemia, unspecified: Secondary | ICD-10-CM | POA: Diagnosis not present

## 2015-06-20 DIAGNOSIS — Z79899 Other long term (current) drug therapy: Secondary | ICD-10-CM | POA: Insufficient documentation

## 2015-06-20 DIAGNOSIS — F419 Anxiety disorder, unspecified: Secondary | ICD-10-CM | POA: Diagnosis not present

## 2015-06-20 DIAGNOSIS — K219 Gastro-esophageal reflux disease without esophagitis: Secondary | ICD-10-CM | POA: Diagnosis not present

## 2015-06-20 DIAGNOSIS — Z8739 Personal history of other diseases of the musculoskeletal system and connective tissue: Secondary | ICD-10-CM | POA: Diagnosis not present

## 2015-06-20 DIAGNOSIS — Z87442 Personal history of urinary calculi: Secondary | ICD-10-CM | POA: Diagnosis not present

## 2015-06-20 DIAGNOSIS — R42 Dizziness and giddiness: Secondary | ICD-10-CM | POA: Insufficient documentation

## 2015-06-20 DIAGNOSIS — F329 Major depressive disorder, single episode, unspecified: Secondary | ICD-10-CM | POA: Insufficient documentation

## 2015-06-20 DIAGNOSIS — G8929 Other chronic pain: Secondary | ICD-10-CM | POA: Insufficient documentation

## 2015-06-20 DIAGNOSIS — Z9981 Dependence on supplemental oxygen: Secondary | ICD-10-CM | POA: Insufficient documentation

## 2015-06-20 DIAGNOSIS — R1030 Lower abdominal pain, unspecified: Secondary | ICD-10-CM | POA: Diagnosis present

## 2015-06-20 DIAGNOSIS — Z87448 Personal history of other diseases of urinary system: Secondary | ICD-10-CM | POA: Insufficient documentation

## 2015-06-20 LAB — CBC
HCT: 37.3 % (ref 36.0–46.0)
Hemoglobin: 12 g/dL (ref 12.0–15.0)
MCH: 27.8 pg (ref 26.0–34.0)
MCHC: 32.2 g/dL (ref 30.0–36.0)
MCV: 86.5 fL (ref 78.0–100.0)
Platelets: 190 10*3/uL (ref 150–400)
RBC: 4.31 MIL/uL (ref 3.87–5.11)
RDW: 13.3 % (ref 11.5–15.5)
WBC: 12.7 10*3/uL — ABNORMAL HIGH (ref 4.0–10.5)

## 2015-06-20 LAB — COMPREHENSIVE METABOLIC PANEL
ALT: 19 U/L (ref 14–54)
AST: 22 U/L (ref 15–41)
Albumin: 3.9 g/dL (ref 3.5–5.0)
Alkaline Phosphatase: 57 U/L (ref 38–126)
Anion gap: 11 (ref 5–15)
BUN: 8 mg/dL (ref 6–20)
CO2: 27 mmol/L (ref 22–32)
Calcium: 9 mg/dL (ref 8.9–10.3)
Chloride: 98 mmol/L — ABNORMAL LOW (ref 101–111)
Creatinine, Ser: 0.85 mg/dL (ref 0.44–1.00)
GFR calc Af Amer: >60 mL/min (ref >60)
GFR calc non Af Amer: >60 mL/min (ref >60)
Glucose, Bld: 171 mg/dL — ABNORMAL HIGH (ref 65–99)
Potassium: 3.7 mmol/L (ref 3.5–5.1)
Sodium: 136 mmol/L (ref 135–145)
Total Bilirubin: 1.6 mg/dL — ABNORMAL HIGH (ref 0.3–1.2)
Total Protein: 7.6 g/dL (ref 6.5–8.1)

## 2015-06-20 LAB — URINALYSIS, ROUTINE W REFLEX MICROSCOPIC
Bilirubin Urine: NEGATIVE
Glucose, UA: NEGATIVE mg/dL
Ketones, ur: NEGATIVE mg/dL
Nitrite: POSITIVE — AB
Protein, ur: 100 mg/dL — AB
Specific Gravity, Urine: 1.011 (ref 1.005–1.030)
pH: 6.5 (ref 5.0–8.0)

## 2015-06-20 LAB — URINE MICROSCOPIC-ADD ON: Squamous Epithelial / LPF: NONE SEEN

## 2015-06-20 LAB — LIPASE, BLOOD: Lipase: 14 U/L (ref 11–51)

## 2015-06-20 MED ORDER — DEXTROSE 5 % IV SOLN
1.0000 g | Freq: Once | INTRAVENOUS | Status: AC
  Administered 2015-06-20: 1 g via INTRAVENOUS
  Filled 2015-06-20: qty 10

## 2015-06-20 MED ORDER — CEPHALEXIN 500 MG PO CAPS: 500.0000 mg | ORAL_CAPSULE | Freq: Two times a day (BID) | ORAL | Status: DC

## 2015-06-20 MED ORDER — SODIUM CHLORIDE 0.9 % IV BOLUS (SEPSIS)
1000.0000 mL | Freq: Once | INTRAVENOUS | Status: AC
  Administered 2015-06-20: 1000 mL via INTRAVENOUS

## 2015-06-20 NOTE — ED Notes (Addendum)
Pt presents with c/o abdominal pain for a couple of days as well as burning with urination. Pt was seen at [facility] walk-in and sent over for evaluation of possible pyelonephritis. Pt c/o bladder pain.

## 2015-06-20 NOTE — ED Provider Notes (Signed)
CSN: [id number]     Arrival date & time 06/20/15  1532 History   First MD Initiated Contact with Patient 06/20/15 1859     Chief Complaint  Patient presents with  . Abdominal Pain  . Burning with urination     HPI Comments: 55 year old female presents with 2 weeks of dysuria and suprapubic pain. She has history of chronic UTIs and takes trimethoprim daily for prophylaxis but has been out for several weeks. She states she has a history of kidney stones however this does not feel like the same kind of pain. She presented to [facility] today because of generalized malaise and dizziness. Vitals showed a low grade fever and tachycardia. UA was done which suggested a UTI. The provider there felt that she should be referred to the emergency room for IV antibiotics. She is reporting associated nausea, frequency, urgency, malodorous urine, blood in urine. She has been taking Azo for symptomatic relief. Denies chest pain, shortness of breath, vomiting, diarrhea, vaginal discharge, vaginal itching. She is postmenopausal and has had a hysterectomy.  Patient is a 55 y.o. female presenting with abdominal pain.  Abdominal Pain Associated symptoms: chills, dysuria, fever and hematuria   Associated symptoms: no chest pain, no nausea, no shortness of breath, no vaginal discharge and no vomiting     Past Medical History  Diagnosis Date  . Kidney infection   . Hypertension   . Hypercholesteremia   . Acid reflux   . Depressed   . Urolithiasis   . Nephrolithiasis   . Chronic back pain   . Anxiety   . DJD (degenerative joint disease)   . OSA (obstructive sleep apnea)     mild with AHI 13/hr now on CPAP at 11cm H2O   Past Surgical History  Procedure Laterality Date  . Abdominal hysterectomy    . Knee surgery     Family History  Problem Relation Age of Onset  . CAD Father   . Stroke Mother   . CAD Brother    Social History  Substance Use Topics  . Smoking status: Never Smoker   . Smokeless tobacco:  None  . Alcohol Use: No   OB History    No data available     Review of Systems  Constitutional: Positive for fever and chills.  Respiratory: Negative for shortness of breath.   Cardiovascular: Negative for chest pain.  Gastrointestinal: Positive for abdominal pain. Negative for nausea and vomiting.  Genitourinary: Positive for dysuria and hematuria. Negative for flank pain and vaginal discharge.  Neurological: Positive for light-headedness.    Allergies  Review of patient's allergies indicates no known allergies.  Home Medications   Prior to Admission medications   Medication Sig Start Date End Date Taking? Authorizing Provider  DULoxetine (CYMBALTA) 60 MG capsule Take 60 mg by mouth daily.      Historical Provider, MD  Providence LaniusKrill Oil Ultra Strength 1500 MG CAPS Take 1 capsule by mouth daily.    Historical Provider, MD  Multiple Vitamin (MULTIVITAMIN) tablet Take 1 tablet by mouth daily.      Historical Provider, MD  omeprazole (PRILOSEC) 10 MG capsule Take 10 mg by mouth daily.    Historical Provider, MD  Oxycodone HCl 10 MG TABS  01/10/12   Historical Provider, MD  simvastatin (ZOCOR) 40 MG tablet Take 40 mg by mouth at bedtime.      Historical Provider, MD   BP 130/90 mmHg  Pulse 100  Temp(Src) 98.7 F (37.1 C) (Oral)  Resp  18  SpO2 97%   Physical Exam  Constitutional: She is oriented to person, place, and time. She appears well-developed and well-nourished. No distress.  HENT:  Head: Normocephalic and atraumatic.  Eyes: Conjunctivae are normal. Pupils are equal, round, and reactive to light. Right eye exhibits no discharge. Left eye exhibits no discharge. No scleral icterus.  Neck: Normal range of motion.  Cardiovascular: Normal rate and regular rhythm.  Exam reveals no gallop and no friction rub.   No murmur heard. Pulmonary/Chest: Effort normal. No respiratory distress. She has no wheezes. She has no rales. She exhibits no tenderness.  Abdominal: Soft. She exhibits no  distension and no mass. There is tenderness. There is no rebound and no guarding.  Suprapubic tenderness  Neurological: She is alert and oriented to person, place, and time.  Skin: Skin is warm and dry.  Psychiatric: She has a normal mood and affect.    ED Course  Procedures (including critical care time) Labs Review Labs Reviewed  COMPREHENSIVE METABOLIC PANEL - Abnormal; Notable for the following:    Chloride 98 (*)    Glucose, Bld 171 (*)    Total Bilirubin 1.6 (*)    All other components within normal limits  CBC - Abnormal; Notable for the following:    WBC 12.7 (*)    All other components within normal limits  URINALYSIS, ROUTINE W REFLEX MICROSCOPIC (NOT AT [facility]) - Abnormal; Notable for the following:    Color, Urine AMBER (*)    APPearance TURBID (*)    Hgb urine dipstick SMALL (*)    Protein, ur 100 (*)    Nitrite POSITIVE (*)    Leukocytes, UA LARGE (*)    All other components within normal limits  URINE MICROSCOPIC-ADD ON - Abnormal; Notable for the following:    Bacteria, UA MANY (*)    All other components within normal limits  URINE CULTURE  LIPASE, BLOOD    Imaging Review No results found. I have personally reviewed and evaluated these images and lab results as part of my medical decision-making.   EKG Interpretation None     Meds given in ED:  Medications  sodium chloride 0.9 % bolus 1,000 mL (0 mLs Intravenous Stopped 06/20/15 2054)  cefTRIAXone (ROCEPHIN) 1 g in dextrose 5 % 50 mL IVPB (0 g Intravenous Stopped 06/20/15 2054)    Discharge Medication List as of 06/20/2015  8:56 PM    START taking these medications   Details  cephALEXin (KEFLEX) 500 MG capsule Take 1 capsule (500 mg total) by mouth 2 (two) times daily., Starting 06/20/2015, Until Discontinued, Print         MDM   Final diagnoses:  UTI (lower urinary tract infection)   55 year old female with chronic UTIs presents with UTI-like symptoms. She has been out of her prophylactic  antibiotics for several weeks which is most likely the cause of her symptoms. In UC she was tachy and had a low grade fever. IVF and dose of Ceftriaxone given here. She is tolerating PO. Will give rx for Keflex. She has an appointment with her urologist Dr. Edwyna Shell on Monday and will follow up with him. Pt is NAD, non-toxic with stable VS. Patient informed of clinical course, understand medical decision-making process, and agree with plan.    Bethel Born, PA-C 06/21/15 0020  Derwood Kaplan, MD 06/21/15 1352

## 2015-06-23 LAB — URINE CULTURE: Culture: >=100000 — AB

## 2015-06-24 ENCOUNTER — Telehealth (HOSPITAL_BASED_OUTPATIENT_CLINIC_OR_DEPARTMENT_OTHER): Payer: Self-pay | Admitting: Emergency Medicine

## 2015-06-24 NOTE — Telephone Encounter (Signed)
Post ED Visit - Positive Culture Follow-up  Culture report reviewed by antimicrobial stewardship pharmacist:  []  Enzo BiNathan Batchelder, Pharm.D. []  Celedonio MiyamotoJeremy Frens, Pharm.D., BCPS []  Garvin FilaMike Maccia, Pharm.D. []  Georgina PillionElizabeth Martin, Pharm.D., BCPS []  San LuisMinh Pham, [address]Ph[address]D., BCPS, AAHIVP []  Estella HuskMichelle Turner, Pharm.D., BCPS, AAHIVP []  Tennis Mustassie Stewart, [address]Pharm.D. []  Sherle Poeob Vincent, [place]Donalda Ewings. Meagan Tilley PharmD  Positive urine culture E. coli Treated with cephalexin, organism sensitive to the same and no further patient follow-up is required at this time.  Berle MullMiller, Loza Prell 06/24/2015, 4:30 PM

## 2015-07-30 ENCOUNTER — Encounter: Payer: Self-pay | Admitting: Cardiology

## 2015-07-31 ENCOUNTER — Encounter: Payer: Self-pay | Admitting: Cardiology

## 2015-07-31 ENCOUNTER — Ambulatory Visit (INDEPENDENT_AMBULATORY_CARE_PROVIDER_SITE_OTHER): Payer: Managed Care, Other (non HMO) | Admitting: Cardiology

## 2015-07-31 VITALS — BP 104/68 | HR 76 | Ht 65.0 in | Wt 177.1 lb

## 2015-07-31 DIAGNOSIS — I10 Essential (primary) hypertension: Secondary | ICD-10-CM

## 2015-07-31 DIAGNOSIS — G4733 Obstructive sleep apnea (adult) (pediatric): Secondary | ICD-10-CM

## 2015-07-31 DIAGNOSIS — E669 Obesity, unspecified: Secondary | ICD-10-CM | POA: Diagnosis not present

## 2015-07-31 NOTE — Progress Notes (Signed)
Cardiology Office Note    Date:  07/31/2015   ID:  Tracey ForgetJoellen Fiorito, DOB [birth year], MRN [id number]  PCP:  Allean FoundSMITH,CANDACE THIELE, MD  Cardiologist:  Armanda Magicraci Curtez Brallier, MD   Chief Complaint  Patient presents with  . Sleep Apnea  . Hypertension    History of Present Illness:  Tracey Powell is a 55 y.o. female with a history of OSA and obesity. She is doing well today. She tolerates her CPAP device without difficulty. She tolerates the nasal mask with chin strap and feels the pressure is adequate. She says that she thinks her humidifier is not working and her mouth is very dry in the am.  She feels rested in the am if she sleeps well the night before has no daytime sleepiness.  She does not get much aerobic exercise.     Past Medical History  Diagnosis Date  . Kidney infection   . Hypertension   . Hypercholesteremia   . Acid reflux   . Depressed   . Urolithiasis   . Nephrolithiasis   . Chronic back pain   . Anxiety   . DJD (degenerative joint disease)   . OSA (obstructive sleep apnea)     mild with AHI 13/hr now on CPAP at 11cm H2O    Past Surgical History  Procedure Laterality Date  . Abdominal hysterectomy    . Knee surgery      Current Medications: Outpatient Prescriptions Prior to Visit  Medication Sig Dispense Refill  . DULoxetine (CYMBALTA) 60 MG capsule Take 60 mg by mouth daily.      Providence Lanius. Krill Oil Ultra Strength 1500 MG CAPS Take 1 capsule by mouth daily.    . Multiple Vitamin (MULTIVITAMIN) tablet Take 1 tablet by mouth daily.      Marland Kitchen. omeprazole (PRILOSEC) 10 MG capsule Take 10 mg by mouth daily.    . Oxycodone HCl 10 MG TABS Take 10 mg by mouth as directed. Pt takes 5 mg as directed    . simvastatin (ZOCOR) 40 MG tablet Take 40 mg by mouth at bedtime.      . cephALEXin (KEFLEX) 500 MG capsule Take 1 capsule (500 mg total) by mouth 2 (two) times daily. 20 capsule 0   No facility-administered medications prior to visit.     Allergies:   Review of patient's allergies  indicates no known allergies.   Social History   Social History  . Marital Status: Married    Spouse Name: N/A  . Number of Children: N/A  . Years of Education: N/A   Social History Main Topics  . Smoking status: Never Smoker   . Smokeless tobacco: None  . Alcohol Use: No  . Drug Use: No  . Sexual Activity: Yes   Other Topics Concern  . None   Social History Narrative     Family History:  The patient's family history includes CAD in her brother and father; Stroke in her mother.   ROS:   Please see the history of present illness.    ROS All other systems reviewed and are negative.   PHYSICAL EXAM:   VS:  BP 104/68 mmHg  Pulse 76  Ht 5\' 5"  (1.651 m)  Wt 177 lb 1.9 oz (80.341 kg)  BMI 29.47 kg/m2  SpO2 98%   GEN: Well nourished, well developed, in no acute distress HEENT: normal Neck: no JVD, carotid bruits, or masses Cardiac: RRR; no murmurs, rubs, or gallops,no edema.  Intact distal pulses bilaterally.  Respiratory:  clear to  auscultation bilaterally, normal work of breathing GI: soft, nontender, nondistended, + BS MS: no deformity or atrophy Skin: warm and dry, no rash Neuro:  Alert and Oriented x 3, Strength and sensation are intact Psych: euthymic mood, full affect  Wt Readings from Last 3 Encounters:  07/31/15 177 lb 1.9 oz (80.341 kg)  12/03/13 204 lb (92.534 kg)  02/18/13 199 lb (90.266 kg)      Studies/Labs Reviewed:   EKG:  EKG is not ordered today.    Recent Labs: 06/20/2015: ALT 19; BUN 8; Creatinine, Ser 0.85; Hemoglobin 12.0; Platelets 190; Potassium 3.7; Sodium 136   Lipid Panel No results found for: CHOL, TRIG, HDL, CHOLHDL, VLDL, LDLCALC, LDLDIRECT  Additional studies/ records that were reviewed today include:  None    ASSESSMENT:    1. OSA (obstructive sleep apnea)   2. Essential hypertension   3. Obesity (BMI 30-39.9)      PLAN:  In order of problems listed above:  OSA - the patient is tolerating PAP therapy well without  any problems.  The patient has been using and benefiting from CPAP use and will continue to benefit from therapy.  I will get a d/l from DME.  I have given her a Rx for a new ResMed CPAP device at 15cm H2O with heated humidity.  She will use this to get a new travel device or to get a new machine if hers cannot be fixed when she goes to [facility]. HTN - BP well controlled - diet controlled.  Obesity - I have encouraged him to get into a routine exercise program and cut back on carbs and portions.     Medication Adjustments/Labs and Tests Ordered: Current medicines are reviewed at length with the patient today.  Concerns regarding medicines are outlined above.  Medication changes, Labs and Tests ordered today are listed in the Patient Instructions below.  There are no Patient Instructions on file for this visit.   Signed, Armanda Magic, MD  07/31/2015 8:35 AM    [facility] [address] [place], [place], [place]  [address] Phone: [phone]; Fax: [phone]

## 2015-07-31 NOTE — Patient Instructions (Signed)
Medication Instructions:  Your physician recommends that you continue on your current medications as directed. Please refer to the Current Medication list given to you today.   Labwork: None  Testing/Procedures: None  Follow-Up: Your physician wants you to follow-up in: 1 year with Dr. Mayford Knifeurner. You will receive a reminder letter in the mail two months in advance. If you don't receive a letter, please call our office to schedule the follow-up appointment.   Any Other Special Instructions Will Be Listed Below (If Applicable). You have been given a prescription for a new CPAP. Resmed with heated humidity set at 15 cm H2O.    If you need a refill on your cardiac medications before your next appointment, please call your pharmacy.

## 2015-08-31 ENCOUNTER — Telehealth: Payer: Self-pay | Admitting: Cardiology

## 2015-08-31 DIAGNOSIS — G4733 Obstructive sleep apnea (adult) (pediatric): Secondary | ICD-10-CM

## 2015-08-31 NOTE — Telephone Encounter (Signed)
Left message for patient to call back  

## 2015-08-31 NOTE — Telephone Encounter (Signed)
New message     The pt is having issues with CPAP machine and [facility] needs, the pt needs everything for the CPAP.      The pt was instructed by the nurse that [facility] would be calling and did not hear back from anyone about the CPAP and supplies.

## 2015-09-01 NOTE — Telephone Encounter (Signed)
Left message for patient to call back  

## 2015-09-03 NOTE — Telephone Encounter (Signed)
Patient needing an order for supplies.   [facility] will look at her machine and she will let us know if we need to do anything else.

## 2015-09-03 NOTE — Addendum Note (Signed)
Addended by: Arcola JanskyOOK, Ellowyn Rieves M on: 09/03/2015 02:36 PM   Modules accepted: Orders

## 2016-08-09 ENCOUNTER — Other Ambulatory Visit: Payer: Self-pay | Admitting: Family Medicine

## 2016-08-09 DIAGNOSIS — R1011 Right upper quadrant pain: Secondary | ICD-10-CM

## 2016-09-05 ENCOUNTER — Other Ambulatory Visit: Payer: Managed Care, Other (non HMO)

## 2016-09-14 ENCOUNTER — Ambulatory Visit
Admission: RE | Admit: 2016-09-14 | Discharge: 2016-09-14 | Disposition: A | Discharge status: Home / Self Care | Payer: Commercial Managed Care - PPO | Source: Ambulatory Visit | Attending: Family Medicine | Admitting: Family Medicine

## 2016-09-14 DIAGNOSIS — R1011 Right upper quadrant pain: Secondary | ICD-10-CM

## 2016-11-30 MED FILL — DULoxetine HCL 30 MG CPEP: 30 | 7 days supply | Qty: 21 | Fill #0

## 2016-12-07 MED FILL — DULoxetine HCL 30 MG CPEP: 30 | 90 days supply | Qty: 270 | Fill #0

## 2016-12-14 DIAGNOSIS — G894 Chronic pain syndrome: Secondary | ICD-10-CM | POA: Diagnosis not present

## 2016-12-14 DIAGNOSIS — M5136 Other intervertebral disc degeneration, lumbar region: Secondary | ICD-10-CM | POA: Diagnosis not present

## 2016-12-14 MED FILL — oxyCODONE HCL 10 MG TABS: 10 | 30 days supply | Qty: 90 | Fill #0

## 2016-12-14 MED FILL — CEPHALEXIN 250 MG CAPSULE: 250 | 90 days supply | Qty: 90 | Fill #0

## 2017-01-13 MED FILL — PREVIDENT 5000 1.1% DRY MOU: 1.1 | 30 days supply | Qty: 100 | Fill #0

## 2017-01-13 MED FILL — oxyCODONE HCL 10 MG TABS: 10 | 30 days supply | Qty: 90 | Fill #0

## 2017-02-02 DIAGNOSIS — N3020 Other chronic cystitis without hematuria: Secondary | ICD-10-CM | POA: Diagnosis not present

## 2017-02-02 DIAGNOSIS — R8271 Bacteriuria: Secondary | ICD-10-CM | POA: Diagnosis not present

## 2017-02-02 MED FILL — CEFDINIR 300 MG CAPSULE: 300 | 5 days supply | Qty: 10 | Fill #0

## 2017-02-10 MED FILL — CEPHALEXIN 250 MG CAPSULE: 250 | 10 days supply | Qty: 10 | Fill #0

## 2017-02-13 MED FILL — oxyCODONE HCL 10 MG TABS: 10 | 30 days supply | Qty: 90 | Fill #0

## 2017-02-16 DIAGNOSIS — M5136 Other intervertebral disc degeneration, lumbar region: Secondary | ICD-10-CM | POA: Diagnosis not present

## 2017-02-16 DIAGNOSIS — G894 Chronic pain syndrome: Secondary | ICD-10-CM | POA: Diagnosis not present

## 2017-02-16 MED FILL — predniSONE 50 MG TABS: 50 | 7 days supply | Qty: 7 | Fill #0

## 2017-02-27 MED FILL — CEPHALEXIN 250 MG CAPSULE: 250 | 80 days supply | Qty: 80 | Fill #1

## 2017-03-06 DIAGNOSIS — R7303 Prediabetes: Secondary | ICD-10-CM | POA: Diagnosis not present

## 2017-03-06 DIAGNOSIS — E785 Hyperlipidemia, unspecified: Secondary | ICD-10-CM | POA: Diagnosis not present

## 2017-03-06 DIAGNOSIS — K219 Gastro-esophageal reflux disease without esophagitis: Secondary | ICD-10-CM | POA: Diagnosis not present

## 2017-03-06 DIAGNOSIS — F419 Anxiety disorder, unspecified: Secondary | ICD-10-CM | POA: Diagnosis not present

## 2017-03-09 MED FILL — DULoxetine HCL 60 MG CPEP: 60 | 90 days supply | Qty: 90 | Fill #0

## 2017-03-09 MED FILL — DULoxetine HCL 30 MG CPEP: 30 | 90 days supply | Qty: 90 | Fill #0

## 2017-03-13 DIAGNOSIS — N3020 Other chronic cystitis without hematuria: Secondary | ICD-10-CM | POA: Diagnosis not present

## 2017-03-13 DIAGNOSIS — N3080 Other cystitis without hematuria: Secondary | ICD-10-CM | POA: Diagnosis not present

## 2017-03-13 DIAGNOSIS — R8271 Bacteriuria: Secondary | ICD-10-CM | POA: Diagnosis not present

## 2017-03-13 MED FILL — MONUROL 3 GM SACHET: 3 | 1 days supply | Qty: 1 | Fill #0

## 2017-03-13 MED FILL — METHENAMINE HIPP 1 GM TAB: 1 | 30 days supply | Qty: 60 | Fill #0

## 2017-03-16 MED FILL — oxyCODONE HCL 10 MG TABS: 10 | 30 days supply | Qty: 90 | Fill #0

## 2017-04-13 MED FILL — oxyCODONE HCL 10 MG TABS: 10 | 30 days supply | Qty: 90 | Fill #0

## 2017-05-11 MED FILL — oxyCODONE HCL 10 MG TABS: 10 | 30 days supply | Qty: 90 | Fill #0

## 2017-06-12 MED FILL — DULoxetine HCL 30 MG CPEP: 30 | 30 days supply | Qty: 30 | Fill #1 | Status: TO

## 2017-06-12 MED FILL — METHENAMINE HIPP 1 GM TAB: 1 | 30 days supply | Qty: 60 | Fill #1

## 2017-06-12 MED FILL — DULoxetine HCL 60 MG CPEP: 60 | 30 days supply | Qty: 30 | Fill #1 | Status: TO

## 2017-08-09 MED FILL — SIMVASTATIN 20 MG TABLET: 20 | 90 days supply | Qty: 90 | Fill #0

## 2018-07-29 IMAGING — US US ABDOMEN LIMITED
1 series · 14 of 25 positions shown · non-contrast
Comparison: CT abdomen pelvis 07/29/2016.

CLINICAL DATA: Right upper quadrant pain.

EXAM:
ULTRASOUND ABDOMEN LIMITED RIGHT UPPER QUADRANT

[Series 1: us abdomen limited · 0.23mm/px · 14 of 32 slices shown]
[im 1/32]
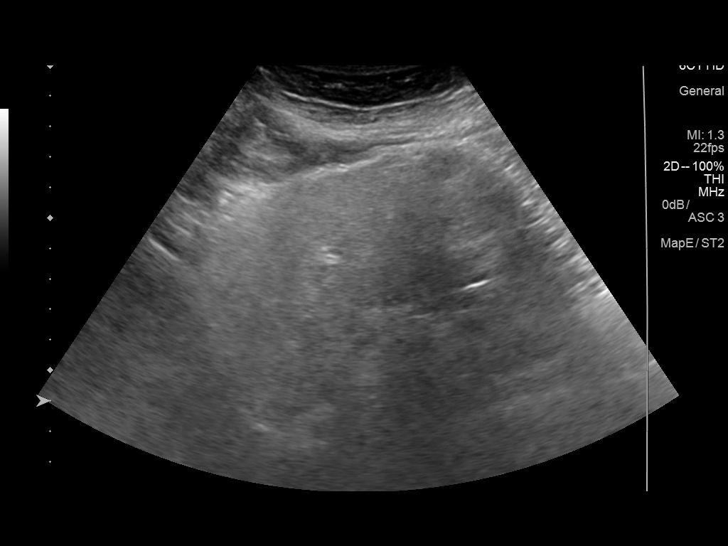
[im 3/32]
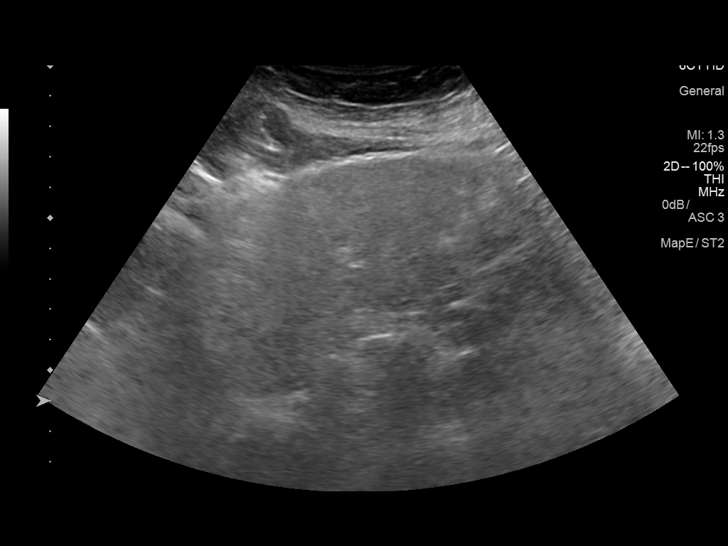
[im 6/32]
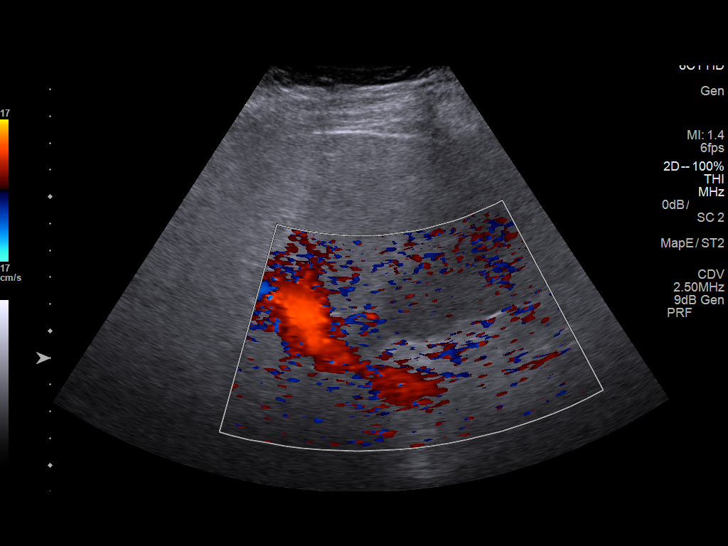
[im 8/32]
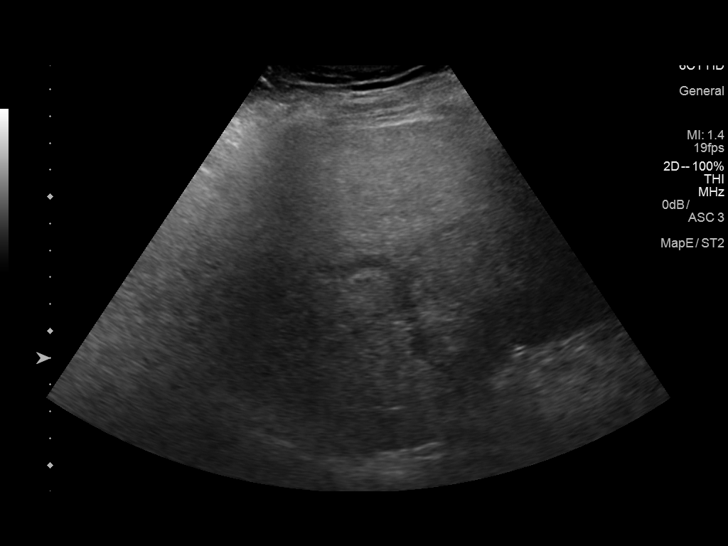
[im 11/32]
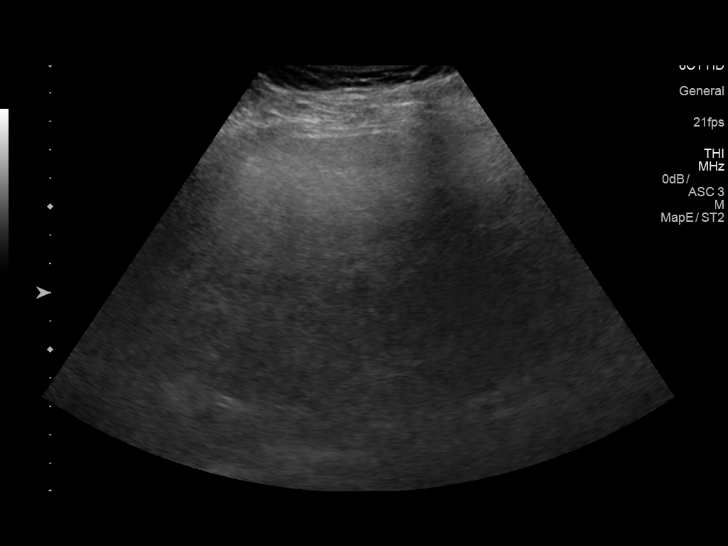
[im 12/32]
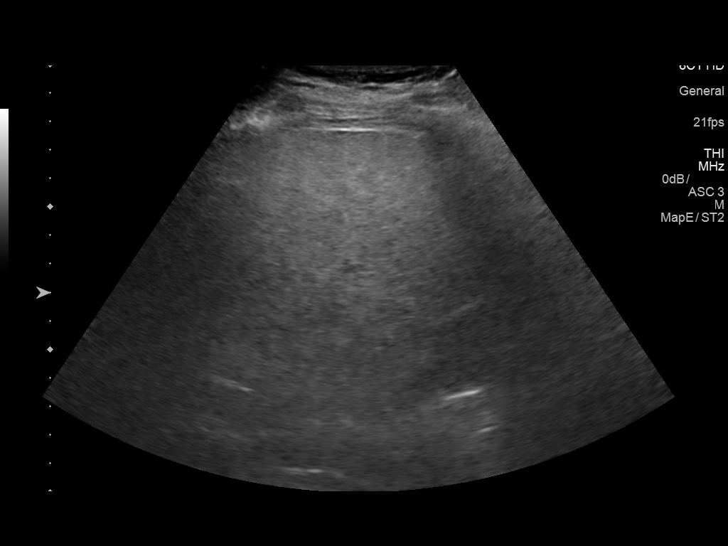
[im 15/32]
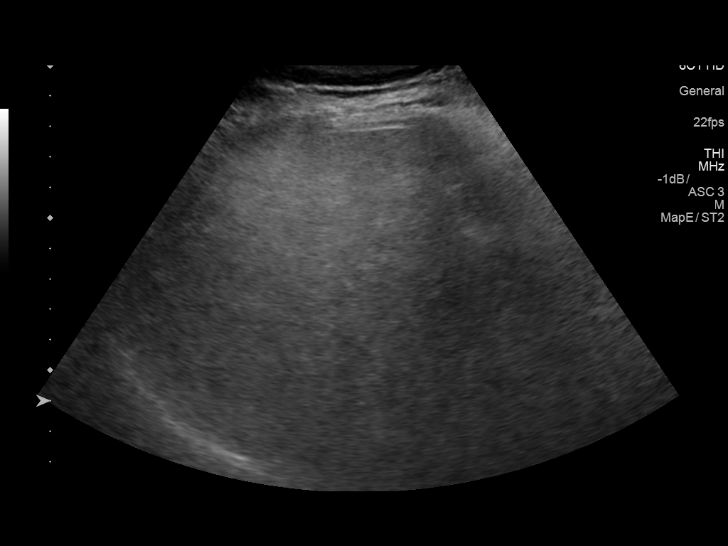
[im 17/32]
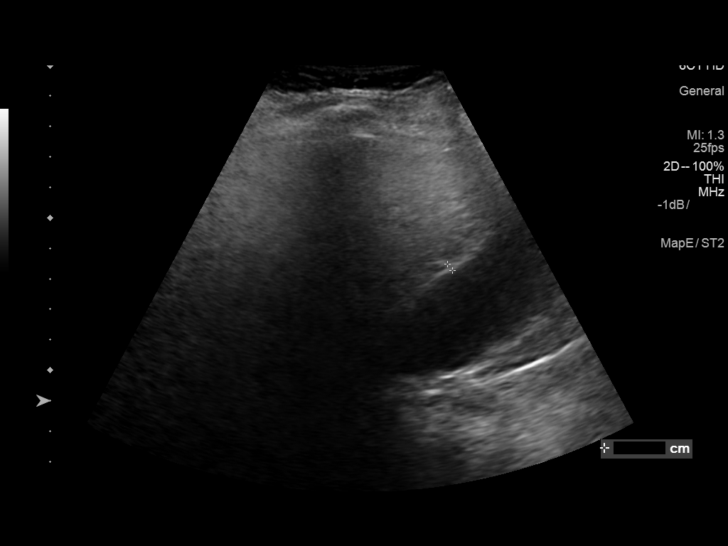
[im 20/32]
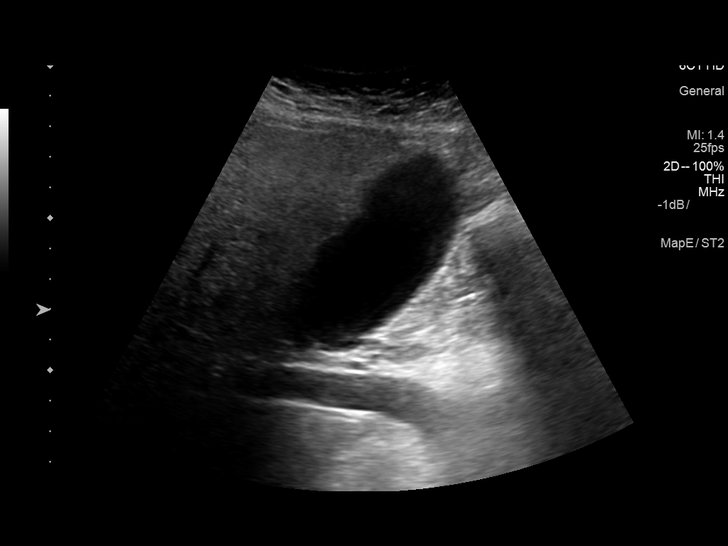
[im 21/32]
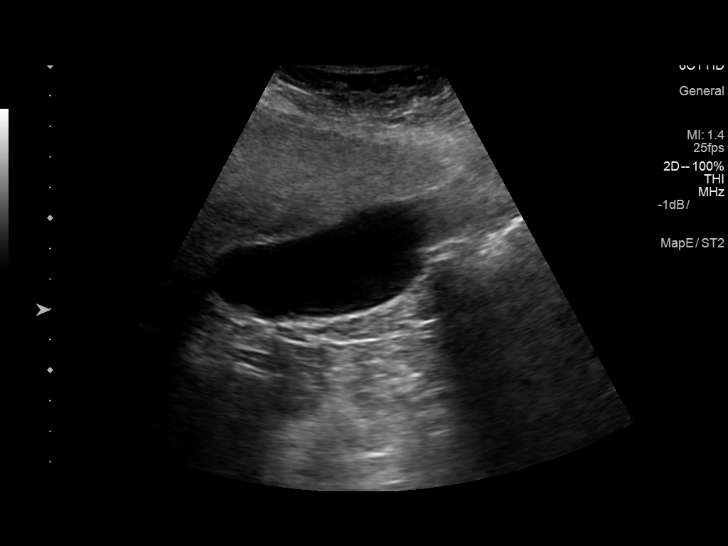
[im 24/32]
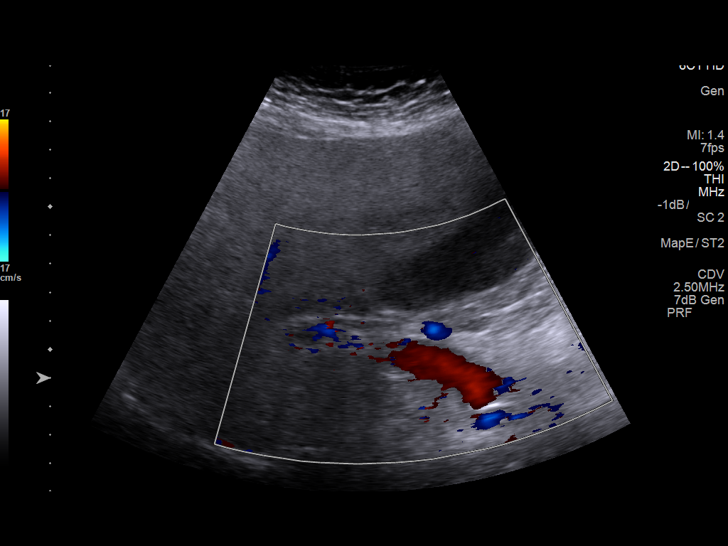
[im 26/32]
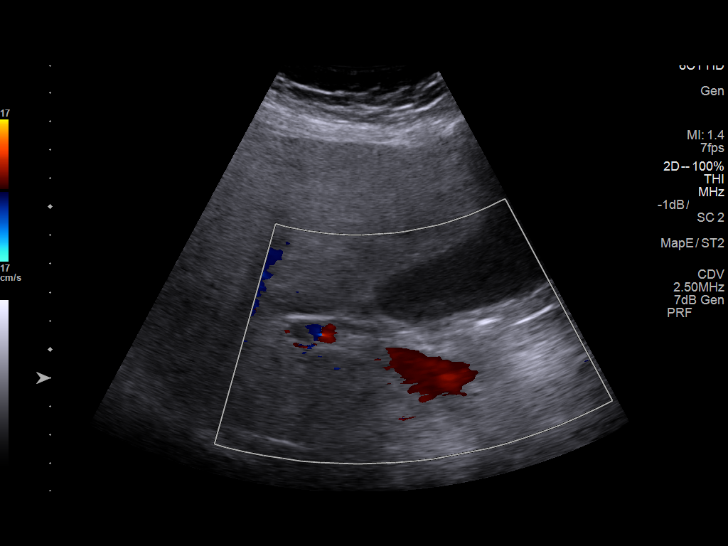
[im 29/32]
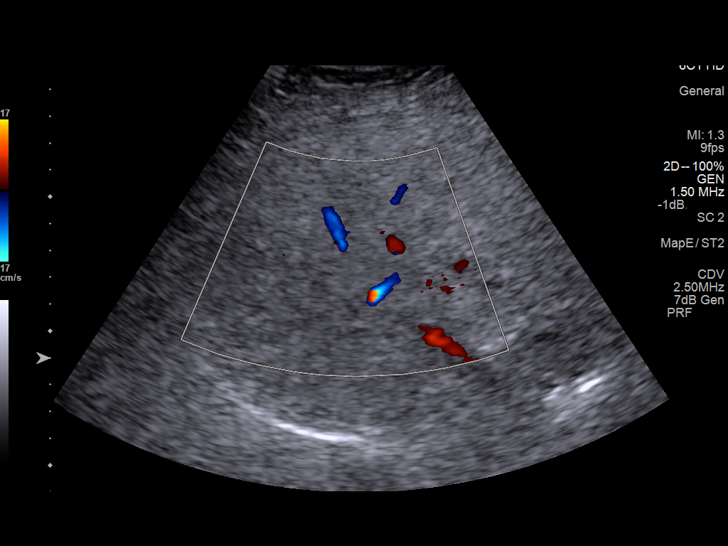
[im 32/32]
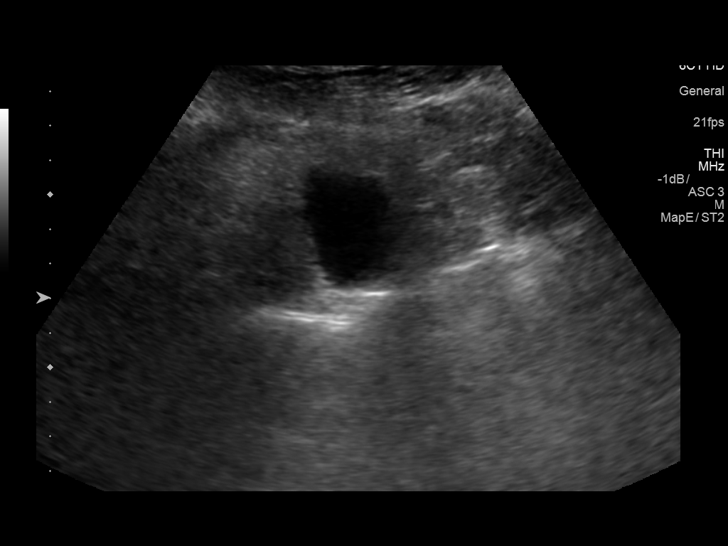

[14 of 25 positions shown; findings below may reference images not displayed]

FINDINGS: Gallbladder:

No gallstones or wall thickening visualized. No sonographic Murphy
sign noted by sonographer.

Common bile duct:

Diameter: 3 mm, within normal limits.

Liver:

Diffusely increased in echogenicity and coarsened in texture.
Limited visualization of the portal vein shows hepatopetal flow.
IMPRESSION: 1. No acute findings.
2. Hepatic steatosis.

## 2018-11-06 ENCOUNTER — Telehealth: Payer: Self-pay | Admitting: Cardiology

## 2018-11-06 NOTE — Telephone Encounter (Signed)

## 2018-11-06 NOTE — Progress Notes (Signed)
Virtual Visit via Video Note   This visit type was conducted due to national recommendations for restrictions regarding the COVID-19 Pandemic (e.g. social distancing) in an effort to limit this patient's exposure and mitigate transmission in our community.  Due to her co-morbid illnesses, this patient is at least at moderate risk for complications without adequate follow up.  This format is felt to be most appropriate for this patient at this time.  All issues noted in this document were discussed and addressed.  A limited physical exam was performed with this format.  Please refer to the patient's chart for her consent to telehealth for [facility].   Evaluation Performed:  Cardiology Consult  This visit type was conducted due to national recommendations for restrictions regarding the COVID-19 Pandemic (e.g. social distancing).  This format is felt to be most appropriate for this patient at this time.  All issues noted in this document were discussed and addressed.  No physical exam was performed (except for noted visual exam findings with Video Visits).  Please refer to the patient's chart (MyChart message for video visits and phone note for telephone visits) for the patient's consent to telehealth for [facility].  Date:  11/07/2018   ID:  Tracey Powell, DOB 1961/02/22, MRN [id number]  Patient Location:  Home  Provider location:   [place]  PCP:  Carol Ada, MD  Cardiologist:   Electrophysiologist:  None   Chief Complaint:  OSA  History of Present Illness:    Tracey Powell is a 58 y.o. female who presents via audio/video conferencing for a telehealth visit today.    This is a 58yo female with a hx of mild OSA with an AHI of 13/hr and has been on CPAP at 33cm H2O.  She is here today for followup and is doing well.  She is doing well with her CPAP device and thinks that she has gotten used to it.  She tolerates the nasal mask and feels the pressure is adequate.  She has  some mouth dryness.  She uses a chin strap but thinks it has stretched out too much. .  She does not think that he snores.  She has had some problems with fatigue and daytime sleepiness but got her B12 checked and it was low and after getting on B12 she feels better.   The patient does not have symptoms concerning for COVID-19 infection (fever, chills, cough, or new shortness of breath).    Prior CV studies:   The following studies were reviewed today:  PAP compliance download  Past Medical History:  Diagnosis Date   Acid reflux    Anxiety    Chronic back pain    Depressed    DJD (degenerative joint disease)    Hypercholesteremia    Hypertension    Kidney infection    Nephrolithiasis    OSA (obstructive sleep apnea)    mild with AHI 13/hr now on CPAP at 11cm H2O   Urolithiasis    Past Surgical History:  Procedure Laterality Date   ABDOMINAL HYSTERECTOMY     KNEE SURGERY       Current Meds  Medication Sig   Cholecalciferol (VITAMIN D) 50 MCG (2000 UT) CAPS Take 2,000 Units by mouth daily.   DULoxetine (CYMBALTA) 30 MG capsule Take 90 mg by mouth daily.   Krill Oil Ultra Strength 1500 MG CAPS Take 1 capsule by mouth daily.   lansoprazole (PREVACID) 15 MG capsule Take 15 mg by mouth daily at 12  noon.   magnesium oxide (MAG-OX) 400 MG tablet Take 400 mg by mouth 2 (two) times daily.   Multiple Vitamin (MULTIVITAMIN) tablet Take 1 tablet by mouth daily.     Oxycodone HCl 10 MG TABS Take 10 mg by mouth as directed. Pt takes 5 mg as directed   simvastatin (ZOCOR) 40 MG tablet Take 40 mg by mouth at bedtime.     vitamin B-12 (CYANOCOBALAMIN) 1000 MCG tablet Take 1,000 mcg by mouth daily.     Allergies:   Patient has no known allergies.   Social History   Tobacco Use   Smoking status: Never Smoker  Substance Use Topics   Alcohol use: No   Drug use: No     Family Hx: The patient's family history includes CAD in her brother and father; Stroke in  her mother.  ROS:   Please see the history of present illness.     All other systems reviewed and are negative.   Labs/Other Tests and Data Reviewed:    Recent Labs: No results found for requested labs within last 8760 hours.   Recent Lipid Panel No results found for: CHOL, TRIG, HDL, CHOLHDL, LDLCALC, LDLDIRECT  Wt Readings from Last 3 Encounters:  11/07/18 170 lb (77.1 kg)  07/31/15 177 lb 1.9 oz (80.3 kg)  12/03/13 204 lb (92.5 kg)     Objective:    Vital Signs:  BP 114/90    Pulse 69    Ht 5\' 5"  (1.651 m)    Wt 170 lb (77.1 kg)    BMI 28.29 kg/m    CONSTITUTIONAL:  Well nourished, well developed female in no acute distress.  EYES: anicteric MOUTH: oral mucosa is pink RESPIRATORY: Normal respiratory effort, symmetric expansion CARDIOVASCULAR: No peripheral edema SKIN: No rash, lesions or ulcers MUSCULOSKELETAL: no digital cyanosis NEURO: Cranial Nerves II-XII grossly intact, moves all extremities PSYCH: Intact judgement and insight.  A&O x 3, Mood/affect appropriate   ASSESSMENT & PLAN:    1.  OSA -  The patient is tolerating PAP therapy well without any problems. The PAP download was reviewed today and showed an AHI of 5.9/hr on 15 cm H2O with 17% compliance in using more than 4 hours nightly.  The patient has been using and benefiting from PAP use and will continue to benefit from therapy.  Her compliance is down because she fell and hit her mouth and cut her mouth and has not been able to use the device  last week.  Otherwise  she uses her device nightly. She has some mouth dryness on the PAP but her chin strap has worn out.  I will get a new chin strap.  She has some fatigue and hard to determine how much is from her B12 def and how much is related to slightly high AHI.  I suspect the AHI is elevated due to mouth breathing.  I will see her back in 6 weeks to see if getting a new chin strap has helped with fatigue. If she is still fatigued and if download shows  persistently elevated AHI then will increase PAP pressure at that time.  2.  Hypertension - BP controlled.  She has not required antihypertensive meds.  3.  Obesity - I have encouraged her to get into a routine exercise program and cut back on carbs and portions.   COVID-19 Education: The signs and symptoms of COVID-19 were discussed with the patient and how to seek care for testing (follow up with PCP or  arrange E-visit).  The importance of social distancing was discussed today.  Patient Risk:   After full review of this patient's clinical status, I feel that they are at least moderate risk at this time.  Time:   Today, I have spent 20 minutes directly with the patient on telemedicine discussing medical problems including OSA, HTN, obesity.  We also reviewed the symptoms of COVID 19 and the ways to protect against contracting the virus with telehealth technology.  I spent an additional 5 minutes reviewing patient's chart including PAP compliance download.  Medication Adjustments/Labs and Tests Ordered: Current medicines are reviewed at length with the patient today.  Concerns regarding medicines are outlined above.  Tests Ordered: No orders of the defined types were placed in this encounter.  Medication Changes: No orders of the defined types were placed in this encounter.   Disposition:  Follow up with me in 6 weeks  Signed, Armanda Magicraci Aleighna Wojtas, MD  11/07/2018 10:02 AM    [facility]

## 2018-11-07 ENCOUNTER — Telehealth (INDEPENDENT_AMBULATORY_CARE_PROVIDER_SITE_OTHER): Payer: 59 | Admitting: Cardiology

## 2018-11-07 ENCOUNTER — Telehealth: Payer: Self-pay | Admitting: *Deleted

## 2018-11-07 ENCOUNTER — Other Ambulatory Visit: Payer: Self-pay

## 2018-11-07 VITALS — BP 114/90 | HR 69 | Ht 65.0 in | Wt 170.0 lb

## 2018-11-07 DIAGNOSIS — G4733 Obstructive sleep apnea (adult) (pediatric): Secondary | ICD-10-CM | POA: Diagnosis not present

## 2018-11-07 DIAGNOSIS — E669 Obesity, unspecified: Secondary | ICD-10-CM | POA: Diagnosis not present

## 2018-11-07 DIAGNOSIS — I10 Essential (primary) hypertension: Secondary | ICD-10-CM | POA: Diagnosis not present

## 2018-11-07 NOTE — Telephone Encounter (Signed)
Order placed to [facility] via fax [phone] to Order a new chin strap and mask

## 2018-11-07 NOTE — Patient Instructions (Addendum)
Medication Instructions:  Your physician recommends that you continue on your current medications as directed. Please refer to the Current Medication list given to you today.  If you need a refill on your cardiac medications before your next appointment, please call your pharmacy.   Lab work: none If you have labs (blood work) drawn today and your tests are completely normal, you will receive your results only by: Marland Kitchen MyChart Message (if you have MyChart) OR . A paper copy in the mail If you have any lab test that is abnormal or we need to change your treatment, we will call you to review the results.  Testing/Procedures: none  Follow-Up: Your physician recommends that you schedule a follow-up appointment in: 6 weeks. --Scheduled for October 16,2020 at 9:40

## 2018-11-07 NOTE — Telephone Encounter (Signed)
-----   Message from Sueanne Margarita, MD sent at 11/07/2018 10:08 AM EDT ----- Order a new chin strap and mask.  Followup with me in 1 year

## 2018-12-21 ENCOUNTER — Telehealth: Payer: 59 | Admitting: Cardiology

## 2019-01-23 ENCOUNTER — Telehealth: Payer: 59 | Admitting: Cardiology

## 2019-03-19 DIAGNOSIS — G4733 Obstructive sleep apnea (adult) (pediatric): Secondary | ICD-10-CM | POA: Diagnosis not present

## 2019-03-28 DIAGNOSIS — L570 Actinic keratosis: Secondary | ICD-10-CM | POA: Diagnosis not present

## 2019-03-28 DIAGNOSIS — Z85828 Personal history of other malignant neoplasm of skin: Secondary | ICD-10-CM | POA: Diagnosis not present

## 2019-03-28 DIAGNOSIS — C44722 Squamous cell carcinoma of skin of right lower limb, including hip: Secondary | ICD-10-CM | POA: Diagnosis not present

## 2019-03-28 DIAGNOSIS — M79642 Pain in left hand: Secondary | ICD-10-CM | POA: Diagnosis not present

## 2019-03-28 DIAGNOSIS — M13842 Other specified arthritis, left hand: Secondary | ICD-10-CM | POA: Diagnosis not present

## 2019-04-07 NOTE — Progress Notes (Signed)
Virtual Visit via Video Note   This visit type was conducted due to national recommendations for restrictions regarding the COVID-19 Pandemic (e.g. social distancing) in an effort to limit this patient's exposure and mitigate transmission in our community.  Due to her co-morbid illnesses, this patient is at least at moderate risk for complications without adequate follow up.  This format is felt to be most appropriate for this patient at this time.  All issues noted in this document were discussed and addressed.  A limited physical exam was performed with this format.  Please refer to the patient's chart for her consent to telehealth for [facility].   Evaluation Performed:  Follow-up visit  This visit type was conducted due to national recommendations for restrictions regarding the COVID-19 Pandemic (e.g. social distancing).  This format is felt to be most appropriate for this patient at this time.  All issues noted in this document were discussed and addressed.  No physical exam was performed (except for noted visual exam findings with Video Visits).  Please refer to the patient's chart (MyChart message for video visits and phone note for telephone visits) for the patient's consent to telehealth for [facility].  Date:  04/09/2019   ID:  Tracey Powell, DOB 1961/01/28, MRN [id number]  Patient Location:  Home  Provider location:   [place]  PCP:  Merri Brunette, MD  Sleep medicine:  Armanda Magic, MD Electrophysiologist:  None   Chief Complaint:  OSA  History of Present Illness:    Tracey Powell is a 59 y.o. female who presents via audio/video conferencing for a telehealth visit today.    This is a 59yo female with a hx of mild OSA with an AHI of 13/hr and has been on CPAP at 33cm H2O.  She is doing well with her CPAP device and thinks that she has gotten used to it.  She tolerates the mask and feels the pressure is adequate.  Since going on CPAP she feels rested in the am and  has no significant daytime sleepiness.  She denies any significant mouth or nasal dryness or nasal congestion.  She does not think that he snores.    The patient does not have symptoms concerning for COVID-19 infection (fever, chills, cough, or new shortness of breath).   Prior CV studies:   The following studies were reviewed today:  PAP compliance download  Past Medical History:  Diagnosis Date  . Acid reflux   . Anxiety   . Chronic back pain   . Depressed   . DJD (degenerative joint disease)   . Hypercholesteremia   . Hypertension   . Kidney infection   . Nephrolithiasis   . OSA (obstructive sleep apnea)    mild with AHI 13/hr now on CPAP at 11cm H2O  . Urolithiasis    Past Surgical History:  Procedure Laterality Date  . ABDOMINAL HYSTERECTOMY    . KNEE SURGERY       Current Meds  Medication Sig  . Cholecalciferol (VITAMIN D) 50 MCG (2000 UT) CAPS Take 2,000 Units by mouth daily.  . DULoxetine (CYMBALTA) 30 MG capsule Take 90 mg by mouth daily.  [place] Lanius Ultra Strength 1500 MG CAPS Take 1 capsule by mouth daily.  . lansoprazole (PREVACID) 15 MG capsule Take 15 mg by mouth daily at 12 noon.  . magnesium oxide (MAG-OX) 400 MG tablet Take 400 mg by mouth daily.   . Multiple Vitamin (MULTIVITAMIN) tablet Take 1 tablet by mouth daily.    Marland Kitchen  Oxycodone HCl 10 MG TABS Take 10 mg by mouth as needed. Take 10 mg as needed up to three times per day  . simvastatin (ZOCOR) 40 MG tablet Take 40 mg by mouth at bedtime.    . vitamin B-12 (CYANOCOBALAMIN) 1000 MCG tablet Take 1,000 mcg by mouth daily.     Allergies:   Patient has no known allergies.   Social History   Tobacco Use  . Smoking status: Never Smoker  . Smokeless tobacco: Never Used  Substance Use Topics  . Alcohol use: No  . Drug use: No     Family Hx: The patient's family history includes CAD in her brother and father; Stroke in her mother.  ROS:   Please see the history of present illness.     All other  systems reviewed and are negative.   Labs/Other Tests and Data Reviewed:    Recent Labs: No results found for requested labs within last 8760 hours.   Recent Lipid Panel No results found for: CHOL, TRIG, HDL, CHOLHDL, LDLCALC, LDLDIRECT  Wt Readings from Last 3 Encounters:  04/09/19 167 lb 4.8 oz (75.9 kg)  11/07/18 170 lb (77.1 kg)  07/31/15 177 lb 1.9 oz (80.3 kg)     Objective:    Vital Signs:  Ht 5\' 5"  (1.651 m)   Wt 167 lb 4.8 oz (75.9 kg)   BMI 27.84 kg/m     ASSESSMENT & PLAN:    1.  OSA -  The PAP download was reviewed today and showed an AHI of 10/hr on 15 cm H2O with 23% compliance in using more than 4 hours nightly.  The download showed a large mask leak. She just got a new mask so I will get a download in 4 weeks to see if AHI is ok.  The patient has been using and benefiting from PAP use and will continue to benefit from therapy.   2. HTN -BP controlled -she is currently not on BP controlled meds   COVID-19 Education: The signs and symptoms of COVID-19 were discussed with the patient and how to seek care for testing (follow up with PCP or arrange E-visit).  The importance of social distancing was discussed today.  Patient Risk:   After full review of this patient's clinical status, I feel that they are at least moderate risk at this time.  Time:   Today, I have spent 15 minutes directly with the patient on telemedicine discussing medical problems including OSA, HTN, Obesity.  We also reviewed the symptoms of COVID 19 and the ways to protect against contracting the virus with telehealth technology.  I spent an additional 5 minutes reviewing patient's chart including PAP compliance download.  Medication Adjustments/Labs and Tests Ordered: Current medicines are reviewed at length with the patient today.  Concerns regarding medicines are outlined above.  Tests Ordered: No orders of the defined types were placed in this encounter.  Medication Changes: No  orders of the defined types were placed in this encounter.   Disposition:  Follow up in 1 year(s)  Signed, Fransico Him, MD  04/09/2019 8:51 AM    [facility]

## 2019-04-09 ENCOUNTER — Telehealth: Payer: Self-pay | Admitting: *Deleted

## 2019-04-09 ENCOUNTER — Telehealth (INDEPENDENT_AMBULATORY_CARE_PROVIDER_SITE_OTHER): Payer: 59 | Admitting: Cardiology

## 2019-04-09 ENCOUNTER — Other Ambulatory Visit: Payer: Self-pay

## 2019-04-09 ENCOUNTER — Encounter: Payer: Self-pay | Admitting: Cardiology

## 2019-04-09 VITALS — Ht 65.0 in | Wt 167.3 lb

## 2019-04-09 DIAGNOSIS — I10 Essential (primary) hypertension: Secondary | ICD-10-CM

## 2019-04-09 DIAGNOSIS — G4733 Obstructive sleep apnea (adult) (pediatric): Secondary | ICD-10-CM

## 2019-04-09 DIAGNOSIS — E669 Obesity, unspecified: Secondary | ICD-10-CM | POA: Diagnosis not present

## 2019-04-09 NOTE — Telephone Encounter (Signed)
-----   Message from Quintella Reichert, MD sent at 04/09/2019  9:03 AM EST ----- Please get a dowlnoad in 4 weeks

## 2019-04-09 NOTE — Telephone Encounter (Signed)
4 week reminder made and 1 year follow up visit made.

## 2019-05-02 DIAGNOSIS — M13842 Other specified arthritis, left hand: Secondary | ICD-10-CM | POA: Diagnosis not present

## 2019-05-29 DIAGNOSIS — G894 Chronic pain syndrome: Secondary | ICD-10-CM | POA: Diagnosis not present

## 2019-07-01 DIAGNOSIS — N200 Calculus of kidney: Secondary | ICD-10-CM | POA: Diagnosis not present

## 2019-07-01 DIAGNOSIS — N3020 Other chronic cystitis without hematuria: Secondary | ICD-10-CM | POA: Diagnosis not present

## 2019-07-02 ENCOUNTER — Other Ambulatory Visit: Payer: Self-pay | Admitting: Urology

## 2019-07-02 DIAGNOSIS — N200 Calculus of kidney: Secondary | ICD-10-CM

## 2019-07-08 ENCOUNTER — Other Ambulatory Visit (HOSPITAL_COMMUNITY)
Admission: RE | Admit: 2019-07-08 | Discharge: 2019-07-08 | Disposition: A | Discharge status: Home / Self Care | Payer: 59 | Source: Ambulatory Visit | Attending: Urology | Admitting: Urology

## 2019-07-08 DIAGNOSIS — Z20822 Contact with and (suspected) exposure to covid-19: Secondary | ICD-10-CM | POA: Diagnosis not present

## 2019-07-08 DIAGNOSIS — Z01812 Encounter for preprocedural laboratory examination: Secondary | ICD-10-CM | POA: Insufficient documentation

## 2019-07-09 ENCOUNTER — Encounter (HOSPITAL_BASED_OUTPATIENT_CLINIC_OR_DEPARTMENT_OTHER): Payer: Self-pay | Admitting: Urology

## 2019-07-09 ENCOUNTER — Other Ambulatory Visit: Payer: Self-pay | Admitting: Urology

## 2019-07-09 LAB — SARS CORONAVIRUS 2 (TAT 6-24 HRS): SARS Coronavirus 2: NEGATIVE

## 2019-07-09 NOTE — Progress Notes (Signed)
Talked with patient concerning litho on Thursday. NPO after MN except sip of water with am pills. Arrival time 67.

## 2019-07-11 ENCOUNTER — Ambulatory Visit (HOSPITAL_COMMUNITY): Payer: 59

## 2019-07-11 ENCOUNTER — Encounter (HOSPITAL_BASED_OUTPATIENT_CLINIC_OR_DEPARTMENT_OTHER): Payer: Self-pay | Admitting: Urology

## 2019-07-11 ENCOUNTER — Ambulatory Visit (HOSPITAL_BASED_OUTPATIENT_CLINIC_OR_DEPARTMENT_OTHER)
Admission: RE | Admit: 2019-07-11 | Discharge: 2019-07-11 | Disposition: A | Discharge status: Home / Self Care | Payer: 59 | Attending: Urology | Admitting: Urology

## 2019-07-11 ENCOUNTER — Encounter (HOSPITAL_BASED_OUTPATIENT_CLINIC_OR_DEPARTMENT_OTHER)
Admission: RE | Disposition: A | Discharge status: Home / Self Care | Payer: Self-pay | Source: Home / Self Care | Attending: Urology

## 2019-07-11 ENCOUNTER — Other Ambulatory Visit: Payer: Self-pay

## 2019-07-11 DIAGNOSIS — M1990 Unspecified osteoarthritis, unspecified site: Secondary | ICD-10-CM | POA: Insufficient documentation

## 2019-07-11 DIAGNOSIS — K219 Gastro-esophageal reflux disease without esophagitis: Secondary | ICD-10-CM | POA: Diagnosis not present

## 2019-07-11 DIAGNOSIS — G8929 Other chronic pain: Secondary | ICD-10-CM | POA: Diagnosis not present

## 2019-07-11 DIAGNOSIS — G4733 Obstructive sleep apnea (adult) (pediatric): Secondary | ICD-10-CM | POA: Insufficient documentation

## 2019-07-11 DIAGNOSIS — F329 Major depressive disorder, single episode, unspecified: Secondary | ICD-10-CM | POA: Diagnosis not present

## 2019-07-11 DIAGNOSIS — F419 Anxiety disorder, unspecified: Secondary | ICD-10-CM | POA: Insufficient documentation

## 2019-07-11 DIAGNOSIS — N200 Calculus of kidney: Secondary | ICD-10-CM

## 2019-07-11 DIAGNOSIS — I10 Essential (primary) hypertension: Secondary | ICD-10-CM | POA: Diagnosis not present

## 2019-07-11 DIAGNOSIS — R69 Illness, unspecified: Secondary | ICD-10-CM | POA: Diagnosis not present

## 2019-07-11 DIAGNOSIS — Z8249 Family history of ischemic heart disease and other diseases of the circulatory system: Secondary | ICD-10-CM | POA: Diagnosis not present

## 2019-07-11 DIAGNOSIS — M549 Dorsalgia, unspecified: Secondary | ICD-10-CM | POA: Insufficient documentation

## 2019-07-11 DIAGNOSIS — Z823 Family history of stroke: Secondary | ICD-10-CM | POA: Diagnosis not present

## 2019-07-11 DIAGNOSIS — Z01818 Encounter for other preprocedural examination: Secondary | ICD-10-CM | POA: Diagnosis not present

## 2019-07-11 DIAGNOSIS — E7800 Pure hypercholesterolemia, unspecified: Secondary | ICD-10-CM | POA: Insufficient documentation

## 2019-07-11 DIAGNOSIS — Z90710 Acquired absence of both cervix and uterus: Secondary | ICD-10-CM | POA: Diagnosis not present

## 2019-07-11 HISTORY — PX: EXTRACORPOREAL SHOCK WAVE LITHOTRIPSY: SHX1557

## 2019-07-11 SURGERY — LITHOTRIPSY, ESWL
Anesthesia: LOCAL | Laterality: Right

## 2019-07-11 MED ORDER — CIPROFLOXACIN HCL 500 MG PO TABS
ORAL_TABLET | ORAL | Status: AC
  Filled 2019-07-11: qty 1

## 2019-07-11 MED ORDER — DIAZEPAM 5 MG PO TABS
10.0000 mg | ORAL_TABLET | ORAL | Status: AC
  Administered 2019-07-11: 10 mg via ORAL

## 2019-07-11 MED ORDER — DIAZEPAM 5 MG PO TABS
ORAL_TABLET | ORAL | Status: AC
Start: 2019-07-11 — End: ?
  Filled 2019-07-11: qty 2

## 2019-07-11 MED ORDER — CIPROFLOXACIN HCL 500 MG PO TABS
500.0000 mg | ORAL_TABLET | ORAL | Status: AC
  Administered 2019-07-11: 500 mg via ORAL

## 2019-07-11 MED ORDER — TAMSULOSIN HCL 0.4 MG PO CAPS: 0.4000 mg | ORAL_CAPSULE | Freq: Every day | ORAL | 0 refills | Status: DC

## 2019-07-11 MED ORDER — SODIUM CHLORIDE 0.9 % IV SOLN: INTRAVENOUS | Status: DC

## 2019-07-11 MED ORDER — DIPHENHYDRAMINE HCL 25 MG PO CAPS
25.0000 mg | ORAL_CAPSULE | ORAL | Status: AC
  Administered 2019-07-11: 25 mg via ORAL

## 2019-07-11 MED ORDER — DIPHENHYDRAMINE HCL 25 MG PO CAPS
ORAL_CAPSULE | ORAL | Status: AC
  Filled 2019-07-11: qty 1

## 2019-07-11 NOTE — Op Note (Signed)
[facility] Operative Note  Treating Physician: Rhoderick Moody, MD  Pre-op diagnosis: 1.2 cm right renal stone  Post-op diagnosis: Same   Procedure: RIGHT ESWL  See Rojelio Brenner OP note scanned into chart. Also because of the size, density, location and other factors that cannot be anticipated I feel this will likely be a staged procedure. This fact supersedes any indication in the scanned [place] stone operative note to the contrary

## 2019-07-11 NOTE — H&P (Addendum)
Urology Preoperative H&P   Chief Complaint: Kidney stones  History of Present Illness: Tracey Powell is a 59 y.o. female with bilateral kidney stones noted on KUB 07/01/19 with Dr. Louis Meckel.  Her bilateral stones measure ~1.2 cm.  She is here today for ESWL to address her RIGHT sided stone burden.  She is urinating w/o difficulty and denies interval UTIs, dysuria or hematuria.     Past Medical History:  Diagnosis Date  . Acid reflux   . Anxiety   . Chronic back pain   . Depressed   . DJD (degenerative joint disease)   . Hypercholesteremia   . Hypertension   . Kidney infection   . Nephrolithiasis   . OSA (obstructive sleep apnea)    mild with AHI 13/hr now on CPAP at 11cm H2O  . Urolithiasis     Past Surgical History:  Procedure Laterality Date  . ABDOMINAL HYSTERECTOMY    . KNEE SURGERY      Allergies: No Known Allergies  Family History  Problem Relation Age of Onset  . Stroke Mother   . CAD Father   . CAD Brother     Social History:  reports that she has never smoked. She has never used smokeless tobacco. She reports that she does not drink alcohol or use drugs.  ROS: A complete review of systems was performed.  All systems are negative except for pertinent findings as noted.  Physical Exam:  Vital signs in last 24 hours:   Constitutional:  Alert and oriented, No acute distress Cardiovascular: Regular rate and rhythm, No JVD Respiratory: Normal respiratory effort, Lungs clear bilaterally GI: Abdomen is soft, nontender, nondistended, no abdominal masses GU: No CVA tenderness Lymphatic: No lymphadenopathy Neurologic: Grossly intact, no focal deficits Psychiatric: Normal mood and affect  Laboratory Data:  No results for input(s): WBC, HGB, HCT, PLT in the last 72 hours.  No results for input(s): NA, K, CL, GLUCOSE, BUN, CALCIUM, CREATININE in the last 72 hours.  Invalid input(s): CO3   No results found for this or any previous visit (from the past 24  hour(s)). Recent Results (from the past 240 hour(s))  SARS CORONAVIRUS 2 (TAT 6-24 HRS) Nasopharyngeal Nasopharyngeal Swab     Status: None   Collection Time: 07/08/19  3:21 PM   Specimen: Nasopharyngeal Swab  Result Value Ref Range Status   SARS Coronavirus 2 NEGATIVE NEGATIVE Final    Comment: (NOTE) SARS-CoV-2 target nucleic acids are NOT DETECTED. The SARS-CoV-2 RNA is generally detectable in upper and lower respiratory specimens during the acute phase of infection. Negative results do not preclude SARS-CoV-2 infection, do not rule out co-infections with other pathogens, and should not be used as the sole basis for treatment or other patient management decisions. Negative results must be combined with clinical observations, patient history, and epidemiological information. The expected result is Negative. Fact Sheet for Patients: SugarRoll.be Fact Sheet for Healthcare Providers: https://www.woods-mathews.com/ This test is not yet approved or cleared by the [place] FDA and  has been authorized for detection and/or diagnosis of SARS-CoV-2 by FDA under an Emergency Use Authorization (EUA). This EUA will remain  in effect (meaning this test can be used) for the duration of the COVID-19 declaration under Section 56 4(b)(1) of the Act, 21 U.S.C. section 360bbb-3(b)(1), unless the authorization is terminated or revoked sooner. Performed at [facility], [place] [address], [place], [place] [address]     Renal Function: No results for input(s): CREATININE in the last 168 hours.  CrCl cannot be calculated (Patient's most recent lab result is older than the maximum 21 days allowed.).  Radiologic Imaging: No results found.  I independently reviewed the above imaging studies.  Assessment and Plan Tracey Powell is a 59 y.o. female with bilateral renal stones  The risks, benefits and alternatives of RIGHT ESWL was discussed with the  patient. I described the risks which include arrhythmia, kidney contusion, kidney hemorrhage, need for transfusion, back discomfort, flank ecchymosis, flank abrasion, inability to fracture the stone, inability to pass stone fragments, Steinstrasse, infection associated with obstructing stones, need for an alternative surgical procedure and possible need for repeat shockwave lithotripsy.  The patient voices understanding and wishes to proceed.    Rhoderick Moody, MD 07/11/2019, 9:46 AM  [facility] Pager: [phone]

## 2019-07-11 NOTE — Discharge Instructions (Signed)
Lithotripsy, Care After This sheet gives you information about how to care for yourself after your procedure. Your health care provider may also give you more specific instructions. If you have problems or questions, contact your health care provider. What can I expect after the procedure? After the procedure, it is common to have:  Some blood in your urine. This should only last for a few days.  Soreness in your back, sides, or upper abdomen for a few days.  Blotches or bruises on your back where the pressure wave entered the skin.  Pain, discomfort, or nausea when pieces (fragments) of the kidney stone move through the tube that carries urine from the kidney to the bladder (ureter). Stone fragments may pass soon after the procedure, but they may continue to pass for up to 4-8 weeks. ? If you have severe pain or nausea, contact your health care provider. This may be caused by a large stone that was not broken up, and this may mean that you need more treatment.  Some pain or discomfort during urination.  Some pain or discomfort in the lower abdomen or (in men) at the base of the penis. Follow these instructions at home: Medicines  Take over-the-counter and prescription medicines only as told by your health care provider.  If you were prescribed an antibiotic medicine, take it as told by your health care provider. Do not stop taking the antibiotic even if you start to feel better.  Do not drive for 24 hours if you were given a medicine to help you relax (sedative).  Do not drive or use heavy machinery while taking prescription pain medicine. Eating and drinking      Drink enough water and fluids to keep your urine clear or pale yellow. This helps any remaining pieces of the stone to pass. It can also help prevent new stones from forming.  Eat plenty of fresh fruits and vegetables.  Follow instructions from your health care provider about eating and drinking restrictions. You may be  instructed: ? To reduce how much salt (sodium) you eat or drink. Check ingredients and nutrition facts on packaged foods and beverages. ? To reduce how much meat you eat.  Eat the recommended amount of calcium for your age and gender. Ask your health care provider how much calcium you should have. General instructions  Get plenty of rest.  Most people can resume normal activities 1-2 days after the procedure. Ask your health care provider what activities are safe for you.  Your health care provider may direct you to lie in a certain position (postural drainage) and tap firmly (percuss) over your kidney area to help stone fragments pass. Follow instructions as told by your health care provider.  If directed, strain all urine through the strainer that was provided by your health care provider. ? Keep all fragments for your health care provider to see. Any stones that are found may be sent to a medical lab for examination. The stone may be as small as a grain of salt.  Keep all follow-up visits as told by your health care provider. This is important. Contact a health care provider if:  You have pain that is severe or does not get better with medicine.  You have nausea that is severe or does not go away.  You have blood in your urine longer than your health care provider told you to expect.  You have more blood in your urine.  You have pain during urination that does   not go away.  You urinate more frequently than usual and this does not go away.  You develop a rash or any other possible signs of an allergic reaction. Get help right away if:  You have severe pain in your back, sides, or upper abdomen.  You have severe pain while urinating.  Your urine is very dark red.  You have blood in your stool (feces).  You cannot pass any urine at all.  You feel a strong urge to urinate after emptying your bladder.  You have a fever or chills.  You develop shortness of breath,  difficulty breathing, or chest pain.  You have severe nausea that leads to persistent vomiting.  You faint. Summary  After this procedure, it is common to have some pain, discomfort, or nausea when pieces (fragments) of the kidney stone move through the tube that carries urine from the kidney to the bladder (ureter). If this pain or nausea is severe, however, you should contact your health care provider.  Most people can resume normal activities 1-2 days after the procedure. Ask your health care provider what activities are safe for you.  Drink enough water and fluids to keep your urine clear or pale yellow. This helps any remaining pieces of the stone to pass, and it can help prevent new stones from forming.  If directed, strain your urine and keep all fragments for your health care provider to see. Fragments or stones may be as small as a grain of salt.  Get help right away if you have severe pain in your back, sides, or upper abdomen or have severe pain while urinating. This information is not intended to replace advice given to you by your health care provider. Make sure you discuss any questions you have with your health care provider. Document Revised: 06/04/2018 Document Reviewed: 01/13/2016 Elsevier Patient Education  2020 [employer] Inc.  

## 2019-07-25 DIAGNOSIS — R8271 Bacteriuria: Secondary | ICD-10-CM | POA: Diagnosis not present

## 2019-07-25 DIAGNOSIS — N200 Calculus of kidney: Secondary | ICD-10-CM | POA: Diagnosis not present

## 2019-07-26 ENCOUNTER — Other Ambulatory Visit: Payer: Self-pay | Admitting: Urology

## 2019-07-26 NOTE — Progress Notes (Signed)
Patient called, no answer. Left message to stop Fish oil 7 days prior to procedure. Will call back for pre-op instructions.

## 2019-07-29 ENCOUNTER — Other Ambulatory Visit (HOSPITAL_COMMUNITY)
Admission: RE | Admit: 2019-07-29 | Discharge: 2019-07-29 | Disposition: A | Discharge status: Home / Self Care | Payer: 59 | Source: Ambulatory Visit | Attending: Urology | Admitting: Urology

## 2019-07-29 DIAGNOSIS — Z20822 Contact with and (suspected) exposure to covid-19: Secondary | ICD-10-CM | POA: Diagnosis not present

## 2019-07-29 DIAGNOSIS — Z01812 Encounter for preprocedural laboratory examination: Secondary | ICD-10-CM | POA: Insufficient documentation

## 2019-07-29 LAB — SARS CORONAVIRUS 2 (TAT 6-24 HRS): SARS Coronavirus 2: NEGATIVE

## 2019-07-30 NOTE — Progress Notes (Signed)
Pre op phone call completed.  Pt is aware she is to arrive at 0800. NPO after midnight. She is aware of what medications to take morning of her surgery.

## 2019-07-31 NOTE — H&P (Signed)
Office Visit Report     07/25/2019   --------------------------------------------------------------------------------   Tracey Powell  MRN: [id number]  DOB: 09-Jan-1961, 59 year old Female  SSN: [id number]   PRIMARY CARE:  Dario Guardian, MD  REFERRING:  Dario Guardian, MD  PROVIDER:  Berniece Salines, M.D.  LOCATION:  [facility] [phone]     --------------------------------------------------------------------------------   CC/HPI: The patient presents today as follow-up status post right shockwave therapy for a interpolar right renal stone. She has passed lots of stone fragments and has been doing quite well. She does have intermittent urgency but no hematuria. She has had some mild flank pain but otherwise is relatively asymptomatic. The she had a KUB prior to her appointment today.   The patient has a history of recurring urinary tract infections and she takes methenamine for this. She has done quite well over the past 12 months from this perspective. She also has a large stone burden on the left lower pole that she would like to also treat with shockwave therapy.     ALLERGIES: No Allergies    MEDICATIONS: Methenamine Hippurate 1 gram tablet 1 tablet PO BID  Prilosec  Simvastatin 10 mg tablet 1 tablet PO Daily  Tamsulosin Hcl 0.4 mg capsule 1 capsule PO Daily  Cymbalta 1 PO Daily  Fish Oil  Lansoprazole 15 mg capsule,delayed release  Mega Red 1 PO Daily  Multivitamin  Oxycodone Hcl 10 mg tablet 1 tablet PO TID PRN  Vitamin D     GU PSH: Cysto Bladder Ureth Biopsy Cystoscopy Insert Stent - about 2017 ESWL, Right - 07/11/2019, about 2017       PSH Notes: .   NON-GU PSH: Elbow Surgery (Unspecified), Left Partial Hysterectomy - about 1996 Rotator cuff surgery, Right - about 2016, Left - about 2010     GU PMH: Chronic cystitis (w/o hematuria) - 04/13/2018, - 08/01/2017, - 2019 (Worsening, Chronic), Culture urine. Will hold Cephalexin at this  time. Empirically begin Cefdinar 300 mg 1 po BID X 5 days till culture complete. May need to consider changing suppression ABX if she is now resistant to Cephalexin. She has had resistance in the past to SMZ-TMP, Nitrofurantoin, Cipro/Levaquin, and Doxycycline. She has never had cysto to r/o fistula with recurrent E coli. Will have pt RTC for cysto w/Dr. Marlou Porch. Recommend she resume vaginal HRT. , - 02/02/2017, - 2018, The patient has recurrent urinary tract infections. She empties her bladder well. 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) - 2019 Candidiasis of vulva and vagina - 2018 Renal calculus (Stable) - 2018 Urinary Tract Inf, Unspec site - 2018    NON-GU PMH: Hypercalciuria - 2018 Anxiety GERD Hypercholesterolemia Hypertension Sleep Apnea    FAMILY HISTORY: 2 daughters - Runs in Family Blood in the urine - Sister Breast Cancer - Sister High Cholesterol - Father, Mother Hypertension - Brother, Mother, Father Kidney Stones - Sister, Mother    Notes: Father deceased at age 62 from a heart attack  Mother deceased at age 29 from COPD   SOCIAL HISTORY: Marital Status: Married Preferred Language: English; Ethnicity: Not Hispanic Or Latino; Race: White Current Smoking Status: Patient has never smoked.   Tobacco Use Assessment Completed: Used Tobacco in last 30 days? Has never drank.  Drinks 2 caffeinated drinks per day. Patient's occupation Scientific laboratory technician.    REVIEW OF SYSTEMS:    GU Review Female:   Patient reports  frequent urination and hard to postpone urination. Patient denies burning /pain with urination, get up at night to urinate, leakage of urine, stream starts and stops, trouble starting your stream, have to strain to urinate, and being pregnant.  Gastrointestinal (Upper):   Patient denies nausea, vomiting, and indigestion/ heartburn.  Gastrointestinal (Lower):   Patient denies diarrhea  and constipation.  Constitutional:   Patient denies weight loss, fever, night sweats, and fatigue.  Skin:   Patient denies skin rash/ lesion and itching.  Eyes:   Patient denies blurred vision and double vision.  Ears/ Nose/ Throat:   Patient denies sore throat and sinus problems.  Hematologic/Lymphatic:   Patient denies swollen glands and easy bruising.  Cardiovascular:   Patient denies leg swelling and chest pains.  Respiratory:   Patient denies cough and shortness of breath.  Endocrine:   Patient denies excessive thirst.  Musculoskeletal:   Patient denies back pain and joint pain.  Neurological:   Patient denies headaches and dizziness.  Psychologic:   Patient denies depression and anxiety.   VITAL SIGNS:      07/25/2019 09:55 AM  BP 127/86 mmHg  Pulse 79 /min   MULTI-SYSTEM PHYSICAL EXAMINATION:    Constitutional: Well-nourished. No physical deformities. Normally developed. Good grooming.  Respiratory: Normal breath sounds. No labored breathing, no use of accessory muscles.   Cardiovascular: Regular rate and rhythm. No murmur, no gallop. Normal temperature, normal extremity pulses, no swelling, no varicosities.      Complexity of Data:  Records Review:   Previous Doctor Records, Previous Patient Records, POC Tool  Urine Test Review:   Urinalysis   PROCEDURES:         KUB - 30076  A single view of the abdomen is obtained. Renal shadows are easily visualized bilaterally. The patient has some residual stone fragments in the right kidney, the left kidney still has large stone burden in the lower pole. There are no additional calcifications along the expected location of either ureter bilaterally.  Gas pattern is grossly normal. No significant bony abnormalities.      . Patient confirmed No Neulasta OnPro Device.           Urinalysis w/Scope Micro  WBC/hpf: >60/hpf  RBC/hpf: 3 - 10/hpf  Bacteria: Many (>50/hpf)  Cystals: Ca Oxalate  Casts: NS (Not Seen)  Trichomonas:  Not Present  Mucous: Not Present  Epithelial Cells: 10 - 20/hpf  Yeast: NS (Not Seen)  Sperm: Not Present    ASSESSMENT:      ICD-10 Details  1 GU:   Renal calculus - N20.0   2   Urinary Tract Inf, Unspec site - N39.0    PLAN:            Medications New Meds: Furosemide 20 mg tablet 1 tablet PO Daily PRN   #10  0 Refill(s)            Orders Labs Urine Culture          Schedule Return Visit/Planned Activity: ASAP - Schedule Surgery          Document Letter(s):  Created for Patient: Clinical Summary         Notes:   The patient has passed quite a bit of the stone from her right lithotripsy, she still has some residual fragments. We talked about how she might passes residual fragments. She does have an inversion table. We talked about using an inversion table and drinking lots of water. We even discussed trialing Lasix a  few times prior to getting on the inversion table to see if that would help pass these fragments. She would like to proceed with shockwave on the left, I will get her set up for this. I sent a urine for culture to ensure that she does not have an infection currently, will treat her and make sure that she is on antibiotics at the time of the follow-up lithotripsy.    * Signed by Louis Meckel, M.D. on 07/25/19 at 4:04 PM (EDT)* * Signed by Louis Meckel, M.D. on 07/25/19 at 4:08 PM (EDT)*     The information contained in this medical record document is considered private and confidential patient information. This information can only be used for the medical diagnosis and/or medical services that are being provided by the patient's selected caregivers. This information can only be distributed outside of the patient's care if the patient agrees and signs waivers of authorization for this information to be sent to an outside source or route.  Add: urine Cx > 100K mixed growth. Dr. Louis Meckel started on Augmentin [phone] in light of procedure tomorrow.

## 2019-08-01 ENCOUNTER — Encounter (HOSPITAL_BASED_OUTPATIENT_CLINIC_OR_DEPARTMENT_OTHER): Payer: Self-pay | Admitting: Urology

## 2019-08-01 ENCOUNTER — Ambulatory Visit (HOSPITAL_BASED_OUTPATIENT_CLINIC_OR_DEPARTMENT_OTHER)
Admission: RE | Admit: 2019-08-01 | Discharge: 2019-08-01 | Disposition: A | Discharge status: Home / Self Care | Payer: 59 | Attending: Urology | Admitting: Urology

## 2019-08-01 ENCOUNTER — Ambulatory Visit (HOSPITAL_COMMUNITY): Payer: 59

## 2019-08-01 ENCOUNTER — Other Ambulatory Visit: Payer: Self-pay

## 2019-08-01 ENCOUNTER — Encounter (HOSPITAL_BASED_OUTPATIENT_CLINIC_OR_DEPARTMENT_OTHER)
Admission: RE | Disposition: A | Discharge status: Home / Self Care | Payer: Self-pay | Source: Home / Self Care | Attending: Urology

## 2019-08-01 DIAGNOSIS — Z8249 Family history of ischemic heart disease and other diseases of the circulatory system: Secondary | ICD-10-CM | POA: Insufficient documentation

## 2019-08-01 DIAGNOSIS — Z79899 Other long term (current) drug therapy: Secondary | ICD-10-CM | POA: Diagnosis not present

## 2019-08-01 DIAGNOSIS — G4730 Sleep apnea, unspecified: Secondary | ICD-10-CM | POA: Diagnosis not present

## 2019-08-01 DIAGNOSIS — K219 Gastro-esophageal reflux disease without esophagitis: Secondary | ICD-10-CM | POA: Insufficient documentation

## 2019-08-01 DIAGNOSIS — Z87440 Personal history of urinary (tract) infections: Secondary | ICD-10-CM | POA: Insufficient documentation

## 2019-08-01 DIAGNOSIS — I10 Essential (primary) hypertension: Secondary | ICD-10-CM | POA: Insufficient documentation

## 2019-08-01 DIAGNOSIS — E7800 Pure hypercholesterolemia, unspecified: Secondary | ICD-10-CM | POA: Insufficient documentation

## 2019-08-01 DIAGNOSIS — N200 Calculus of kidney: Secondary | ICD-10-CM | POA: Diagnosis not present

## 2019-08-01 DIAGNOSIS — F419 Anxiety disorder, unspecified: Secondary | ICD-10-CM | POA: Insufficient documentation

## 2019-08-01 DIAGNOSIS — R69 Illness, unspecified: Secondary | ICD-10-CM | POA: Diagnosis not present

## 2019-08-01 DIAGNOSIS — Z01818 Encounter for other preprocedural examination: Secondary | ICD-10-CM | POA: Diagnosis not present

## 2019-08-01 HISTORY — PX: EXTRACORPOREAL SHOCK WAVE LITHOTRIPSY: SHX1557

## 2019-08-01 HISTORY — DX: Other complications of anesthesia, initial encounter: T88.59XA

## 2019-08-01 SURGERY — LITHOTRIPSY, ESWL
Anesthesia: LOCAL | Laterality: Left

## 2019-08-01 MED ORDER — DIPHENHYDRAMINE HCL 25 MG PO CAPS
25.0000 mg | ORAL_CAPSULE | ORAL | Status: AC
  Administered 2019-08-01: 25 mg via ORAL

## 2019-08-01 MED ORDER — DIPHENHYDRAMINE HCL 25 MG PO CAPS
ORAL_CAPSULE | ORAL | Status: AC
  Filled 2019-08-01: qty 1

## 2019-08-01 MED ORDER — DIAZEPAM 5 MG PO TABS
ORAL_TABLET | ORAL | Status: AC
  Filled 2019-08-01: qty 2

## 2019-08-01 MED ORDER — DIAZEPAM 5 MG PO TABS
10.0000 mg | ORAL_TABLET | ORAL | Status: AC
  Administered 2019-08-01: 10 mg via ORAL

## 2019-08-01 MED ORDER — CIPROFLOXACIN HCL 500 MG PO TABS
ORAL_TABLET | ORAL | Status: AC
  Filled 2019-08-01: qty 1

## 2019-08-01 MED ORDER — SODIUM CHLORIDE 0.9 % IV SOLN: INTRAVENOUS | Status: DC

## 2019-08-01 MED ORDER — CIPROFLOXACIN HCL 500 MG PO TABS
500.0000 mg | ORAL_TABLET | ORAL | Status: AC
  Administered 2019-08-01: 500 mg via ORAL

## 2019-08-01 NOTE — Discharge Instructions (Signed)
Post Anesthesia Home Care Instructions  Activity: Get plenty of rest for the remainder of the day. A responsible adult should stay with you for 24 hours following the procedure.  For the next 24 hours, DO NOT: -Drive a car -Operate machinery -Drink alcoholic beverages -Take any medication unless instructed by your physician -Make any legal decisions or sign important papers.  Meals: Start with liquid foods such as gelatin or soup. Progress to regular foods as tolerated. Avoid greasy, spicy, heavy foods. If nausea and/or vomiting occur, drink only clear liquids until the nausea and/or vomiting subsides. Call your physician if vomiting continues.  Special Instructions/Symptoms: Your throat may feel dry or sore from the anesthesia or the breathing tube placed in your throat during surgery. If this causes discomfort, gargle with warm salt water. The discomfort should disappear within 24 hours.  If you had a scopolamine patch placed behind your ear for the management of post- operative nausea and/or vomiting:  1. The medication in the patch is effective for 72 hours, after which it should be removed.  Wrap patch in a tissue and discard in the trash. Wash hands thoroughly with soap and water. 2. You may remove the patch earlier than 72 hours if you experience unpleasant side effects which may include dry mouth, dizziness or visual disturbances. 3. Avoid touching the patch. Wash your hands with soap and water after contact with the patch.   Lithotripsy, Care After This sheet gives you information about how to care for yourself after your procedure. Your health care provider may also give you more specific instructions. If you have problems or questions, contact your health care provider. What can I expect after the procedure? After the procedure, it is common to have:  Some blood in your urine. This should only last for a few days.  Soreness in your back, sides, or upper abdomen for a few  days.  Blotches or bruises on your back where the pressure wave entered the skin.  Pain, discomfort, or nausea when pieces (fragments) of the kidney stone move through the tube that carries urine from the kidney to the bladder (ureter). Stone fragments may pass soon after the procedure, but they may continue to pass for up to 4-8 weeks. ? If you have severe pain or nausea, contact your health care provider. This may be caused by a large stone that was not broken up, and this may mean that you need more treatment.  Some pain or discomfort during urination.  Some pain or discomfort in the lower abdomen or (in men) at the base of the penis. Follow these instructions at home: Medicines  Take over-the-counter and prescription medicines only as told by your health care provider.  If you were prescribed an antibiotic medicine, take it as told by your health care provider. Do not stop taking the antibiotic even if you start to feel better.  Do not drive for 24 hours if you were given a medicine to help you relax (sedative).  Do not drive or use heavy machinery while taking prescription pain medicine. Eating and drinking      Drink enough water and fluids to keep your urine clear or pale yellow. This helps any remaining pieces of the stone to pass. It can also help prevent new stones from forming.  Eat plenty of fresh fruits and vegetables.  Follow instructions from your health care provider about eating and drinking restrictions. You may be instructed: ? To reduce how much salt (sodium) you eat   or drink. Check ingredients and nutrition facts on packaged foods and beverages. ? To reduce how much meat you eat.  Eat the recommended amount of calcium for your age and gender. Ask your health care provider how much calcium you should have. General instructions  Get plenty of rest.  Most people can resume normal activities 1-2 days after the procedure. Ask your health care provider what  activities are safe for you.  Your health care provider may direct you to lie in a certain position (postural drainage) and tap firmly (percuss) over your kidney area to help stone fragments pass. Follow instructions as told by your health care provider.  If directed, strain all urine through the strainer that was provided by your health care provider. ? Keep all fragments for your health care provider to see. Any stones that are found may be sent to a medical lab for examination. The stone may be as small as a grain of salt.  Keep all follow-up visits as told by your health care provider. This is important. Contact a health care provider if:  You have pain that is severe or does not get better with medicine.  You have nausea that is severe or does not go away.  You have blood in your urine longer than your health care provider told you to expect.  You have more blood in your urine.  You have pain during urination that does not go away.  You urinate more frequently than usual and this does not go away.  You develop a rash or any other possible signs of an allergic reaction. Get help right away if:  You have severe pain in your back, sides, or upper abdomen.  You have severe pain while urinating.  Your urine is very dark red.  You have blood in your stool (feces).  You cannot pass any urine at all.  You feel a strong urge to urinate after emptying your bladder.  You have a fever or chills.  You develop shortness of breath, difficulty breathing, or chest pain.  You have severe nausea that leads to persistent vomiting.  You faint. Summary  After this procedure, it is common to have some pain, discomfort, or nausea when pieces (fragments) of the kidney stone move through the tube that carries urine from the kidney to the bladder (ureter). If this pain or nausea is severe, however, you should contact your health care provider.  Most people can resume normal activities 1-2  days after the procedure. Ask your health care provider what activities are safe for you.  Drink enough water and fluids to keep your urine clear or pale yellow. This helps any remaining pieces of the stone to pass, and it can help prevent new stones from forming.  If directed, strain your urine and keep all fragments for your health care provider to see. Fragments or stones may be as small as a grain of salt.  Get help right away if you have severe pain in your back, sides, or upper abdomen or have severe pain while urinating. This information is not intended to replace advice given to you by your health care provider. Make sure you discuss any questions you have with your health care provider. Document Revised: 06/04/2018 Document Reviewed: 01/13/2016 Elsevier Patient Education  2020 Elsevier Inc.  

## 2019-08-01 NOTE — Interval H&P Note (Signed)
History and Physical Interval Note:  08/01/2019 10:15 AM  Tracey Powell  has presented today for surgery, with the diagnosis of LEFT RENAL STONE.  The various methods of treatment have been discussed with the patient and family. After consideration of risks, benefits and other options for treatment, the patient has consented to  Procedure(s): LEFT EXTRACORPOREAL SHOCK WAVE LITHOTRIPSY (ESWL) (Left) as a surgical intervention.  The patient's history has been reviewed, patient examined, no change in status, stable for surgery.  I have reviewed the patient's chart and labs.  Questions were answered to the patient's satisfaction.  She started Augmentin as planned for a + mixed growth urine cx. Not clinically infected - no dysuria or fever. She passed a few more fragments from her right ESWL earlier this month and doesn't appear to have any obvious fragments on the right, but we still discussed for her to monitor urine output and notify us immediately if she had a lot of pain and or noted decreased UOP.    Jerilee Field

## 2019-08-01 NOTE — Op Note (Signed)
Left ESWL   14 mm LLP stone   Findings: Pt tolerated well. Energy to 4. Stone faded but seemed dense. Rate slowed to 60. She may need a staged procedure if stone fails to break, fragments pass.

## 2019-08-15 DIAGNOSIS — N200 Calculus of kidney: Secondary | ICD-10-CM | POA: Diagnosis not present

## 2019-08-29 DIAGNOSIS — E785 Hyperlipidemia, unspecified: Secondary | ICD-10-CM | POA: Diagnosis not present

## 2019-08-29 DIAGNOSIS — K219 Gastro-esophageal reflux disease without esophagitis: Secondary | ICD-10-CM | POA: Diagnosis not present

## 2019-08-29 DIAGNOSIS — R69 Illness, unspecified: Secondary | ICD-10-CM | POA: Diagnosis not present

## 2019-08-29 DIAGNOSIS — E559 Vitamin D deficiency, unspecified: Secondary | ICD-10-CM | POA: Diagnosis not present

## 2019-08-29 DIAGNOSIS — R7303 Prediabetes: Secondary | ICD-10-CM | POA: Diagnosis not present

## 2019-08-29 DIAGNOSIS — E538 Deficiency of other specified B group vitamins: Secondary | ICD-10-CM | POA: Diagnosis not present

## 2019-09-18 DIAGNOSIS — N200 Calculus of kidney: Secondary | ICD-10-CM | POA: Diagnosis not present

## 2019-09-19 DIAGNOSIS — Z79899 Other long term (current) drug therapy: Secondary | ICD-10-CM | POA: Diagnosis not present

## 2019-09-19 DIAGNOSIS — Z5181 Encounter for therapeutic drug level monitoring: Secondary | ICD-10-CM | POA: Diagnosis not present

## 2019-09-19 DIAGNOSIS — Z87442 Personal history of urinary calculi: Secondary | ICD-10-CM | POA: Diagnosis not present

## 2019-09-19 DIAGNOSIS — G894 Chronic pain syndrome: Secondary | ICD-10-CM | POA: Diagnosis not present

## 2019-09-19 DIAGNOSIS — M5136 Other intervertebral disc degeneration, lumbar region: Secondary | ICD-10-CM | POA: Diagnosis not present

## 2019-10-16 DIAGNOSIS — J069 Acute upper respiratory infection, unspecified: Secondary | ICD-10-CM | POA: Diagnosis not present

## 2019-10-17 DIAGNOSIS — J069 Acute upper respiratory infection, unspecified: Secondary | ICD-10-CM | POA: Diagnosis not present

## 2019-10-17 DIAGNOSIS — Z03818 Encounter for observation for suspected exposure to other biological agents ruled out: Secondary | ICD-10-CM | POA: Diagnosis not present

## 2019-10-17 DIAGNOSIS — R05 Cough: Secondary | ICD-10-CM | POA: Diagnosis not present

## 2019-10-23 DIAGNOSIS — Z1231 Encounter for screening mammogram for malignant neoplasm of breast: Secondary | ICD-10-CM | POA: Diagnosis not present

## 2019-11-19 DIAGNOSIS — M13842 Other specified arthritis, left hand: Secondary | ICD-10-CM | POA: Diagnosis not present

## 2020-01-23 DIAGNOSIS — R52 Pain, unspecified: Secondary | ICD-10-CM | POA: Diagnosis not present

## 2020-04-06 DIAGNOSIS — R7303 Prediabetes: Secondary | ICD-10-CM | POA: Diagnosis not present

## 2020-04-06 DIAGNOSIS — Z0000 Encounter for general adult medical examination without abnormal findings: Secondary | ICD-10-CM | POA: Diagnosis not present

## 2020-04-06 DIAGNOSIS — R69 Illness, unspecified: Secondary | ICD-10-CM | POA: Diagnosis not present

## 2020-04-06 DIAGNOSIS — K219 Gastro-esophageal reflux disease without esophagitis: Secondary | ICD-10-CM | POA: Diagnosis not present

## 2020-04-06 DIAGNOSIS — E785 Hyperlipidemia, unspecified: Secondary | ICD-10-CM | POA: Diagnosis not present

## 2020-04-06 DIAGNOSIS — Z23 Encounter for immunization: Secondary | ICD-10-CM | POA: Diagnosis not present

## 2020-04-29 DIAGNOSIS — M1812 Unilateral primary osteoarthritis of first carpometacarpal joint, left hand: Secondary | ICD-10-CM | POA: Diagnosis not present

## 2020-04-29 DIAGNOSIS — M13842 Other specified arthritis, left hand: Secondary | ICD-10-CM | POA: Diagnosis not present

## 2020-04-29 DIAGNOSIS — G8918 Other acute postprocedural pain: Secondary | ICD-10-CM | POA: Diagnosis not present

## 2020-05-14 DIAGNOSIS — M13842 Other specified arthritis, left hand: Secondary | ICD-10-CM | POA: Diagnosis not present

## 2020-05-21 DIAGNOSIS — G894 Chronic pain syndrome: Secondary | ICD-10-CM | POA: Diagnosis not present

## 2020-06-11 DIAGNOSIS — Z4789 Encounter for other orthopedic aftercare: Secondary | ICD-10-CM | POA: Diagnosis not present

## 2020-06-11 DIAGNOSIS — M13842 Other specified arthritis, left hand: Secondary | ICD-10-CM | POA: Diagnosis not present

## 2020-06-11 DIAGNOSIS — M79645 Pain in left finger(s): Secondary | ICD-10-CM | POA: Diagnosis not present

## 2020-06-17 DIAGNOSIS — G4733 Obstructive sleep apnea (adult) (pediatric): Secondary | ICD-10-CM | POA: Diagnosis not present

## 2020-06-29 DIAGNOSIS — M79645 Pain in left finger(s): Secondary | ICD-10-CM | POA: Diagnosis not present

## 2020-07-09 DIAGNOSIS — M79645 Pain in left finger(s): Secondary | ICD-10-CM | POA: Diagnosis not present

## 2020-07-09 DIAGNOSIS — M13842 Other specified arthritis, left hand: Secondary | ICD-10-CM | POA: Diagnosis not present

## 2020-07-09 DIAGNOSIS — Z4789 Encounter for other orthopedic aftercare: Secondary | ICD-10-CM | POA: Diagnosis not present

## 2020-08-07 DIAGNOSIS — M13842 Other specified arthritis, left hand: Secondary | ICD-10-CM | POA: Diagnosis not present

## 2020-08-12 ENCOUNTER — Telehealth: Payer: 59 | Admitting: Cardiology

## 2020-08-13 DIAGNOSIS — M79645 Pain in left finger(s): Secondary | ICD-10-CM | POA: Diagnosis not present

## 2020-08-21 ENCOUNTER — Other Ambulatory Visit: Payer: Self-pay

## 2020-08-21 ENCOUNTER — Encounter: Payer: 59 | Admitting: Cardiology

## 2020-08-21 ENCOUNTER — Encounter: Payer: Self-pay | Admitting: Cardiology

## 2020-08-21 VITALS — BP 136/89 | HR 69 | Ht 65.0 in | Wt 168.2 lb

## 2020-08-21 IMAGING — DX DG ABDOMEN 1V
2 series · 2 of 2 positions shown · non-contrast
Comparison: July 25, 2019.

CLINICAL DATA: Preop for lithotripsy

EXAM:
ABDOMEN - 1 VIEW

[abdomen kub (1 of 2)]
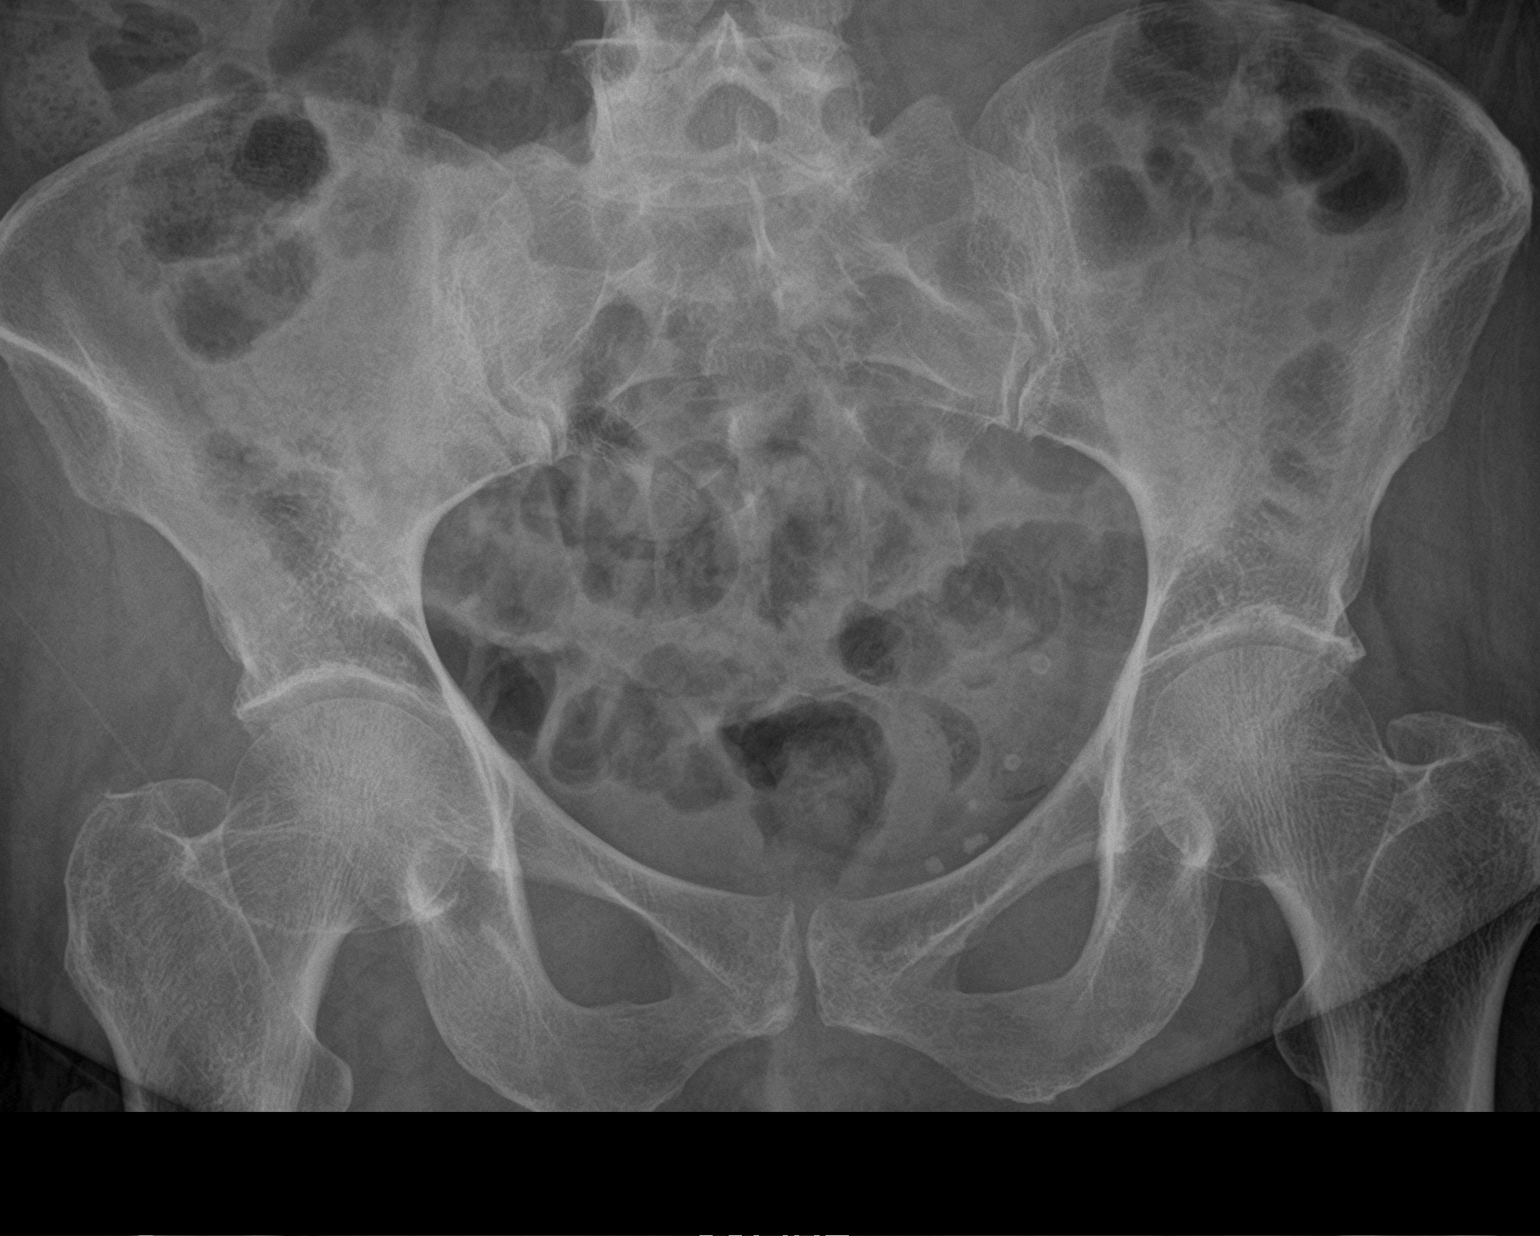

[abdomen kub (2 of 2)]
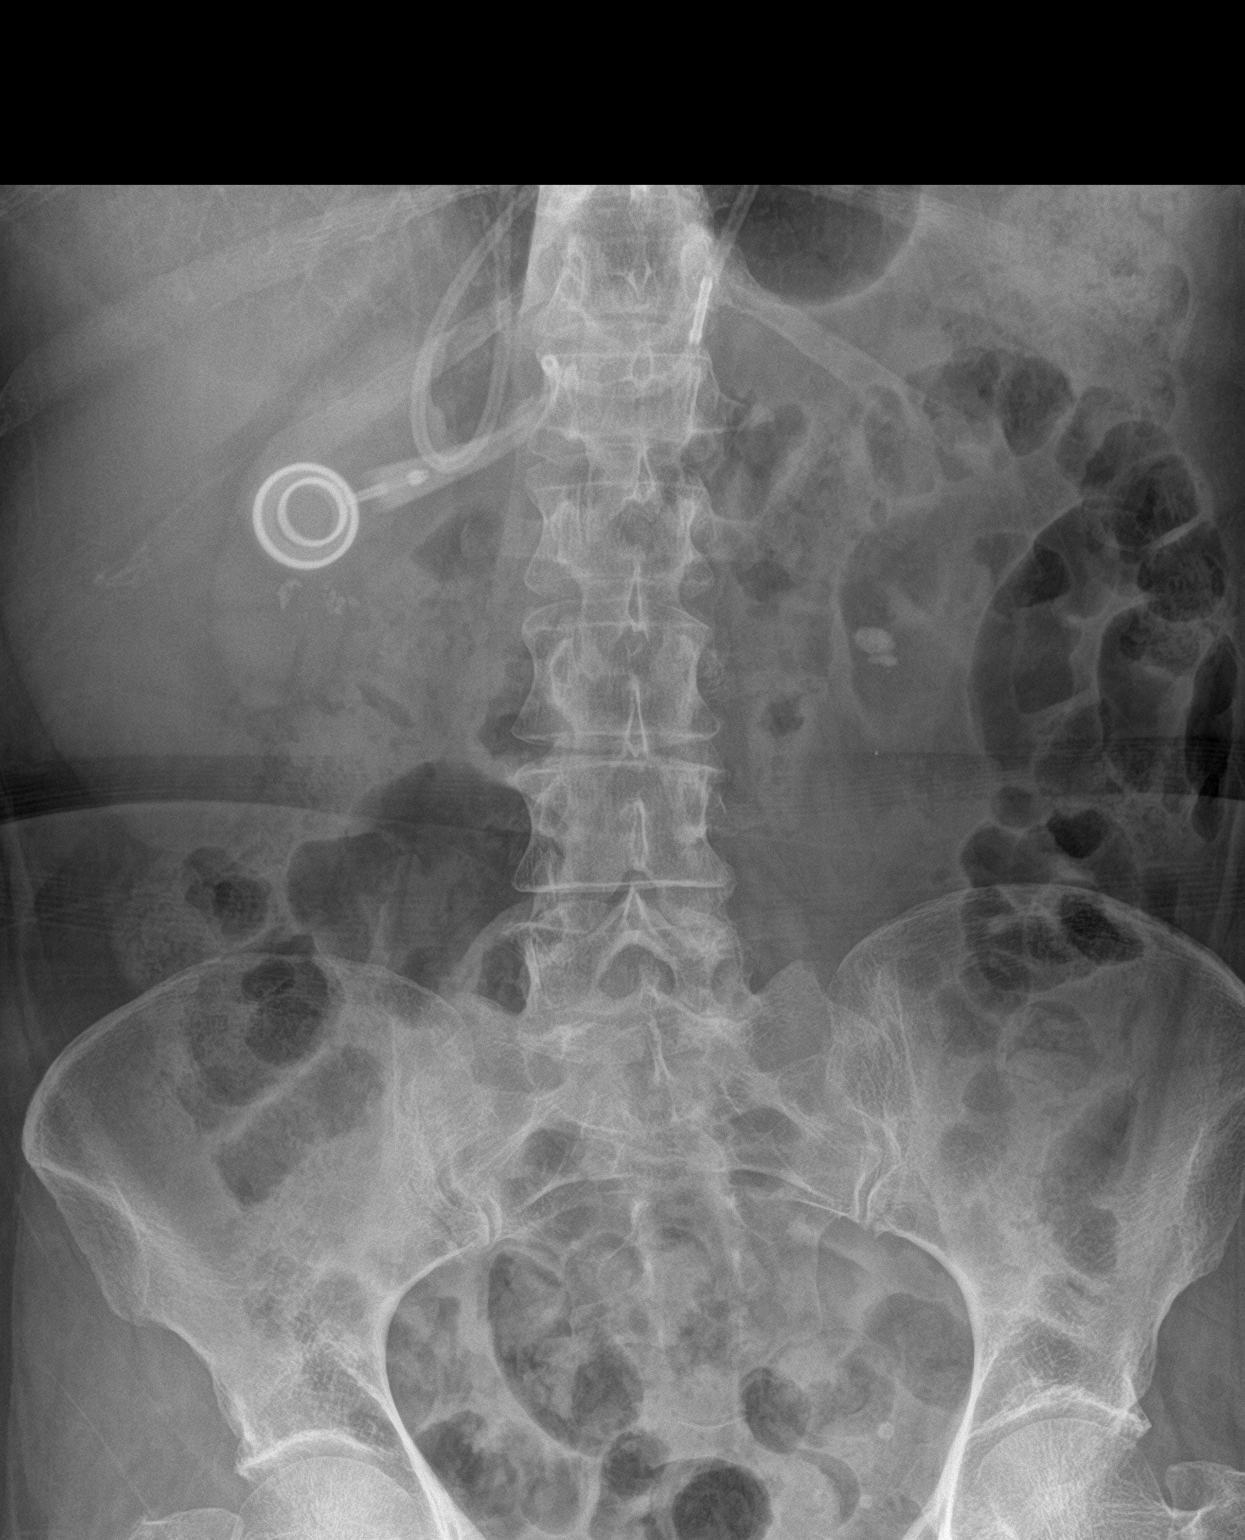

[2 of 2 positions shown; findings below may reference images not displayed]

FINDINGS: The bowel gas pattern is normal. Bilateral nephrolithiasis is noted,
with the largest calculus measuring 11 mm projected over lower pole
of left kidney. Phleboliths are noted in the pelvis.
IMPRESSION: Stable bilateral nephrolithiasis. No evidence of bowel obstruction
or ileus.

## 2020-08-21 NOTE — Progress Notes (Signed)
This encounter was created in error - please disregard.

## 2020-08-26 DIAGNOSIS — M79645 Pain in left finger(s): Secondary | ICD-10-CM | POA: Diagnosis not present

## 2020-09-14 DIAGNOSIS — K219 Gastro-esophageal reflux disease without esophagitis: Secondary | ICD-10-CM | POA: Diagnosis not present

## 2020-09-14 DIAGNOSIS — E785 Hyperlipidemia, unspecified: Secondary | ICD-10-CM | POA: Diagnosis not present

## 2020-09-14 DIAGNOSIS — R7303 Prediabetes: Secondary | ICD-10-CM | POA: Diagnosis not present

## 2020-09-14 DIAGNOSIS — R69 Illness, unspecified: Secondary | ICD-10-CM | POA: Diagnosis not present

## 2020-09-23 DIAGNOSIS — Z79899 Other long term (current) drug therapy: Secondary | ICD-10-CM | POA: Diagnosis not present

## 2020-10-28 DIAGNOSIS — Z780 Asymptomatic menopausal state: Secondary | ICD-10-CM | POA: Diagnosis not present

## 2020-10-28 DIAGNOSIS — Z1231 Encounter for screening mammogram for malignant neoplasm of breast: Secondary | ICD-10-CM | POA: Diagnosis not present

## 2020-10-28 DIAGNOSIS — M8589 Other specified disorders of bone density and structure, multiple sites: Secondary | ICD-10-CM | POA: Diagnosis not present

## 2020-10-28 DIAGNOSIS — Z803 Family history of malignant neoplasm of breast: Secondary | ICD-10-CM | POA: Diagnosis not present

## 2020-11-05 DIAGNOSIS — Z79891 Long term (current) use of opiate analgesic: Secondary | ICD-10-CM | POA: Diagnosis not present

## 2020-11-05 DIAGNOSIS — M13842 Other specified arthritis, left hand: Secondary | ICD-10-CM | POA: Diagnosis not present

## 2020-11-05 DIAGNOSIS — Z5181 Encounter for therapeutic drug level monitoring: Secondary | ICD-10-CM | POA: Diagnosis not present

## 2020-12-10 ENCOUNTER — Ambulatory Visit: Payer: 59 | Admitting: Cardiology

## 2021-01-21 DIAGNOSIS — G894 Chronic pain syndrome: Secondary | ICD-10-CM | POA: Diagnosis not present

## 2021-02-02 ENCOUNTER — Telehealth (INDEPENDENT_AMBULATORY_CARE_PROVIDER_SITE_OTHER): Payer: 59 | Admitting: Cardiology

## 2021-02-02 ENCOUNTER — Encounter: Payer: Self-pay | Admitting: Cardiology

## 2021-02-02 ENCOUNTER — Other Ambulatory Visit: Payer: Self-pay

## 2021-02-02 VITALS — BP 136/84 | HR 80 | Ht 65.0 in | Wt 164.0 lb

## 2021-02-02 DIAGNOSIS — I10 Essential (primary) hypertension: Secondary | ICD-10-CM | POA: Diagnosis not present

## 2021-02-02 DIAGNOSIS — G4733 Obstructive sleep apnea (adult) (pediatric): Secondary | ICD-10-CM | POA: Diagnosis not present

## 2021-02-02 MED ORDER — SALINE SPRAY 0.65 % NA SOLN: 1.0000 | NASAL | 10 refills | Status: AC | PRN

## 2021-02-02 NOTE — Addendum Note (Signed)
Addended by: Theresia Majors on: 02/02/2021 09:33 AM   Modules accepted: Orders

## 2021-02-02 NOTE — Patient Instructions (Signed)
Medication Instructions:  Your physician has recommended you make the following change in your medication:  1) Start using nasal saline spray - two sprays in each nostril twice daily  *If you need a refill on your cardiac medications before your next appointment, please call your pharmacy*  Follow-Up: At [facility], you and your health needs are our priority.  As part of our continuing mission to provide you with exceptional heart care, we have created designated Provider Care Teams.  These Care Teams include your primary Cardiologist (physician) and Advanced Practice Providers (APPs -  Physician Assistants and Nurse Practitioners) who all work together to provide you with the care you need, when you need it.  Your next appointment:   1 year(s)  The format for your next appointment:   In Person  Provider:   Armanda Magic, MD

## 2021-02-02 NOTE — Progress Notes (Signed)
Virtual Visit via Video Note   This visit type was conducted due to national recommendations for restrictions regarding the COVID-19 Pandemic (e.g. social distancing) in an effort to limit this patient's exposure and mitigate transmission in our community.  Due to her co-morbid illnesses, this patient is at least at moderate risk for complications without adequate follow up.  This format is felt to be most appropriate for this patient at this time.  All issues noted in this document were discussed and addressed.  A limited physical exam was performed with this format.  Please refer to the patient's chart for her consent to telehealth for [facility].   Evaluation Performed:  Follow-up visit  Date:  02/02/2021   ID:  Tracey Powell, DOB Feb 14, 1961, MRN [id number]  Patient Location:  Home  Provider location:   [place]  PCP:  Merri Brunette, MD  Sleep medicine:  Armanda Magic, MD Electrophysiologist:  None   Chief Complaint:  OSA  History of Present Illness:    Tracey Powell is a 60 y.o. female who presents via audio/video conferencing for a telehealth visit today.     This is a 60yo female with a hx of mild OSA with an AHI of 13/hr and has been on CPAP at 33cm H2O.  She is doing well with her CPAP device and thinks that she has gotten used to it.  There are some nights that she falls asleep without it.  She tolerates the nasal mask and feels the pressure is adequate.  She uses a chin strap.  Since going on CPAP she feels rested in the am but not always.  She has been having a lot of significant mouth dryness.  She has some intermittent nose dryness.  She does not think that he snores.     The patient does not have symptoms concerning for COVID-19 infection (fever, chills, cough, or new shortness of breath).   Prior CV studies:   The following studies were reviewed today:  PAP compliance download  Past Medical History:  Diagnosis Date   Acid reflux    Anxiety    Chronic  back pain    Complication of anesthesia    slow to wake up   Depressed    DJD (degenerative joint disease)    Hypercholesteremia    Hypertension    Kidney infection    Nephrolithiasis    OSA (obstructive sleep apnea)    mild with AHI 13/hr now on CPAP at 11cm H2O   Urolithiasis    Past Surgical History:  Procedure Laterality Date   ABDOMINAL HYSTERECTOMY     ELBOW SURGERY     EXTRACORPOREAL SHOCK WAVE LITHOTRIPSY Right 07/11/2019   Procedure: EXTRACORPOREAL SHOCK WAVE LITHOTRIPSY (ESWL);  Surgeon: Rene Paci, MD;  Location: [facility];  Service: Urology;  Laterality: Right;   EXTRACORPOREAL SHOCK WAVE LITHOTRIPSY Left 08/01/2019   Procedure: LEFT EXTRACORPOREAL SHOCK WAVE LITHOTRIPSY (ESWL);  Surgeon: Jerilee Field, MD;  Location: [facility];  Service: Urology;  Laterality: Left;   KNEE SURGERY     LAPAROSCOPIC GASTRIC BANDING     LITHOTRIPSY       Current Meds  Medication Sig   Cholecalciferol (VITAMIN D) 50 MCG (2000 UT) CAPS Take 2,000 Units by mouth daily.   DULoxetine (CYMBALTA) 30 MG capsule Take 90 mg by mouth daily.   magnesium oxide (MAG-OX) 400 MG tablet Take 400 mg by mouth daily.    Multiple Vitamin (MULTIVITAMIN) tablet Take 1 tablet by  mouth daily.     Oxycodone HCl 10 MG TABS Take 10 mg by mouth as needed. Take 10 mg as needed up to three times per day   pantoprazole (PROTONIX) 40 MG tablet Take 1 tablet by mouth daily.   simvastatin (ZOCOR) 20 MG tablet Take 1 tablet by mouth daily.   vitamin B-12 (CYANOCOBALAMIN) 1000 MCG tablet Take 1,000 mcg by mouth daily.     Allergies:   Shingrix [zoster vac recomb adjuvanted]   Social History   Tobacco Use   Smoking status: Never   Smokeless tobacco: Never  Substance Use Topics   Alcohol use: No   Drug use: No     Family Hx: The patient's family history includes CAD in her brother and father; Stroke in her mother.  ROS:   Please see the history of present  illness.     All other systems reviewed and are negative.   Labs/Other Tests and Data Reviewed:    Recent Labs: No results found for requested labs within last 8760 hours.   Recent Lipid Panel No results found for: CHOL, TRIG, HDL, CHOLHDL, LDLCALC, LDLDIRECT  Wt Readings from Last 3 Encounters:  02/02/21 164 lb (74.4 kg)  08/21/20 168 lb 3.2 oz (76.3 kg)  08/01/19 167 lb (75.8 kg)     Objective:    Vital Signs:  BP 136/84   Pulse 80   Ht 5\' 5"  (1.651 m)   Wt 164 lb (74.4 kg)   BMI 27.29 kg/m   Well nourished, well developed female in no acute distress. Well appearing, alert and conversant, regular work of breathing,  good skin color  Eyes- anicteric mouth- oral mucosa is pink  neuro- grossly intact skin- no apparent rash or lesions or cyanosis   ASSESSMENT & PLAN:    1. OSA - The patient is tolerating PAP therapy well without any problems. The PAP download performed by his DME was personally reviewed and interpreted by me today and showed an AHI of 10/hr on 15 cm H2O with 57% compliance in using more than 4 hours nightly.  The patient has been using and benefiting from PAP use and will continue to benefit from therapy.  -she has a large mask leak on her download that I suspect is contributing to her increased AHI>>she has not changed out her cushion in a long time and mask is leaking -I encouraged her to change out the cushion and if the mask continues to leak she needs to call the DME -I encouraged her to use nasal saline for nasal congestion  -I will order her a new chin strap to try to keep her mouth open -repeat download in 4 weeks -encouraged her to be more compliant with her device   2. HTN -BP is well controlled on exam today -she is currently not on BP controlled meds   COVID-19 Education: The signs and symptoms of COVID-19 were discussed with the patient and how to seek care for testing (follow up with PCP or arrange E-visit).  The importance of social  distancing was discussed today.  Patient Risk:   After full review of this patient's clinical status, I feel that they are at least moderate risk at this time.  Time:   Today, I have spent 15 minutes directly with the patient on telemedicine discussing medical problems including OSA, HTN, Obesity.  We also reviewed the symptoms of COVID 19 and the ways to protect against contracting the virus with telehealth technology.  I spent an  additional 5 minutes reviewing patient's chart including PAP compliance download.  Medication Adjustments/Labs and Tests Ordered: Current medicines are reviewed at length with the patient today.  Concerns regarding medicines are outlined above.  Tests Ordered: No orders of the defined types were placed in this encounter.  Medication Changes: No orders of the defined types were placed in this encounter.   Disposition:  Follow up in 1 year(s)  Signed, Armanda Magic, MD  02/02/2021 9:09 AM    [facility]

## 2021-02-05 ENCOUNTER — Telehealth: Payer: Self-pay | Admitting: *Deleted

## 2021-02-05 DIAGNOSIS — G4733 Obstructive sleep apnea (adult) (pediatric): Secondary | ICD-10-CM

## 2021-02-05 NOTE — Telephone Encounter (Signed)
Order placed to [facility] via community message.

## 2021-02-05 NOTE — Telephone Encounter (Signed)
-----   Message from Theresia Majors, RN sent at 02/02/2021  9:31 AM EST ----- Please order new chin strap and get a download in 4 weeks.  Thanks!

## 2021-02-22 DIAGNOSIS — J069 Acute upper respiratory infection, unspecified: Secondary | ICD-10-CM | POA: Diagnosis not present

## 2021-02-23 DIAGNOSIS — J069 Acute upper respiratory infection, unspecified: Secondary | ICD-10-CM | POA: Diagnosis not present

## 2021-03-30 DIAGNOSIS — R300 Dysuria: Secondary | ICD-10-CM | POA: Diagnosis not present

## 2021-04-06 DIAGNOSIS — G4733 Obstructive sleep apnea (adult) (pediatric): Secondary | ICD-10-CM | POA: Diagnosis not present

## 2021-04-22 DIAGNOSIS — M5136 Other intervertebral disc degeneration, lumbar region: Secondary | ICD-10-CM | POA: Diagnosis not present

## 2021-04-22 DIAGNOSIS — Z79891 Long term (current) use of opiate analgesic: Secondary | ICD-10-CM | POA: Diagnosis not present

## 2021-04-27 DIAGNOSIS — N898 Other specified noninflammatory disorders of vagina: Secondary | ICD-10-CM | POA: Diagnosis not present

## 2021-04-27 DIAGNOSIS — R1314 Dysphagia, pharyngoesophageal phase: Secondary | ICD-10-CM | POA: Diagnosis not present

## 2021-04-27 DIAGNOSIS — R7303 Prediabetes: Secondary | ICD-10-CM | POA: Diagnosis not present

## 2021-04-27 DIAGNOSIS — I10 Essential (primary) hypertension: Secondary | ICD-10-CM | POA: Diagnosis not present

## 2021-04-27 DIAGNOSIS — E785 Hyperlipidemia, unspecified: Secondary | ICD-10-CM | POA: Diagnosis not present

## 2021-04-27 DIAGNOSIS — K219 Gastro-esophageal reflux disease without esophagitis: Secondary | ICD-10-CM | POA: Diagnosis not present

## 2021-04-27 DIAGNOSIS — Z0000 Encounter for general adult medical examination without abnormal findings: Secondary | ICD-10-CM | POA: Diagnosis not present

## 2021-04-27 DIAGNOSIS — R69 Illness, unspecified: Secondary | ICD-10-CM | POA: Diagnosis not present

## 2021-05-03 DIAGNOSIS — B9620 Unspecified Escherichia coli [E. coli] as the cause of diseases classified elsewhere: Secondary | ICD-10-CM | POA: Diagnosis not present

## 2021-05-03 DIAGNOSIS — N390 Urinary tract infection, site not specified: Secondary | ICD-10-CM | POA: Diagnosis not present

## 2021-06-08 DIAGNOSIS — K219 Gastro-esophageal reflux disease without esophagitis: Secondary | ICD-10-CM | POA: Diagnosis not present

## 2021-06-08 DIAGNOSIS — I10 Essential (primary) hypertension: Secondary | ICD-10-CM | POA: Diagnosis not present

## 2021-06-29 DIAGNOSIS — R69 Illness, unspecified: Secondary | ICD-10-CM | POA: Diagnosis not present

## 2021-06-29 DIAGNOSIS — I10 Essential (primary) hypertension: Secondary | ICD-10-CM | POA: Diagnosis not present

## 2021-07-07 DIAGNOSIS — K5900 Constipation, unspecified: Secondary | ICD-10-CM | POA: Diagnosis not present

## 2021-07-07 DIAGNOSIS — R1310 Dysphagia, unspecified: Secondary | ICD-10-CM | POA: Diagnosis not present

## 2021-07-07 DIAGNOSIS — I959 Hypotension, unspecified: Secondary | ICD-10-CM | POA: Diagnosis not present

## 2021-07-07 DIAGNOSIS — Z1211 Encounter for screening for malignant neoplasm of colon: Secondary | ICD-10-CM | POA: Diagnosis not present

## 2021-08-05 DIAGNOSIS — K2100 Gastro-esophageal reflux disease with esophagitis, without bleeding: Secondary | ICD-10-CM | POA: Diagnosis not present

## 2021-08-05 DIAGNOSIS — D124 Benign neoplasm of descending colon: Secondary | ICD-10-CM | POA: Diagnosis not present

## 2021-08-05 DIAGNOSIS — D125 Benign neoplasm of sigmoid colon: Secondary | ICD-10-CM | POA: Diagnosis not present

## 2021-08-05 DIAGNOSIS — K2930 Chronic superficial gastritis without bleeding: Secondary | ICD-10-CM | POA: Diagnosis not present

## 2021-08-05 DIAGNOSIS — K648 Other hemorrhoids: Secondary | ICD-10-CM | POA: Diagnosis not present

## 2021-08-05 DIAGNOSIS — K2970 Gastritis, unspecified, without bleeding: Secondary | ICD-10-CM | POA: Diagnosis not present

## 2021-08-05 DIAGNOSIS — R1310 Dysphagia, unspecified: Secondary | ICD-10-CM | POA: Diagnosis not present

## 2021-08-05 DIAGNOSIS — K2289 Other specified disease of esophagus: Secondary | ICD-10-CM | POA: Diagnosis not present

## 2021-08-05 DIAGNOSIS — Z1211 Encounter for screening for malignant neoplasm of colon: Secondary | ICD-10-CM | POA: Diagnosis not present

## 2021-08-11 DIAGNOSIS — K2289 Other specified disease of esophagus: Secondary | ICD-10-CM | POA: Diagnosis not present

## 2021-08-11 DIAGNOSIS — D124 Benign neoplasm of descending colon: Secondary | ICD-10-CM | POA: Diagnosis not present

## 2021-08-11 DIAGNOSIS — K2100 Gastro-esophageal reflux disease with esophagitis, without bleeding: Secondary | ICD-10-CM | POA: Diagnosis not present

## 2021-08-11 DIAGNOSIS — D125 Benign neoplasm of sigmoid colon: Secondary | ICD-10-CM | POA: Diagnosis not present

## 2021-08-11 DIAGNOSIS — K2930 Chronic superficial gastritis without bleeding: Secondary | ICD-10-CM | POA: Diagnosis not present

## 2021-08-23 DIAGNOSIS — Z79891 Long term (current) use of opiate analgesic: Secondary | ICD-10-CM | POA: Diagnosis not present

## 2021-08-23 DIAGNOSIS — M5030 Other cervical disc degeneration, unspecified cervical region: Secondary | ICD-10-CM | POA: Diagnosis not present

## 2021-08-23 DIAGNOSIS — M5136 Other intervertebral disc degeneration, lumbar region: Secondary | ICD-10-CM | POA: Diagnosis not present

## 2021-09-02 DIAGNOSIS — R1310 Dysphagia, unspecified: Secondary | ICD-10-CM | POA: Diagnosis not present

## 2021-09-02 DIAGNOSIS — K5900 Constipation, unspecified: Secondary | ICD-10-CM | POA: Diagnosis not present

## 2021-10-04 DIAGNOSIS — G4733 Obstructive sleep apnea (adult) (pediatric): Secondary | ICD-10-CM | POA: Diagnosis not present

## 2021-10-27 DIAGNOSIS — R7303 Prediabetes: Secondary | ICD-10-CM | POA: Diagnosis not present

## 2021-10-27 DIAGNOSIS — I10 Essential (primary) hypertension: Secondary | ICD-10-CM | POA: Diagnosis not present

## 2021-10-27 DIAGNOSIS — K219 Gastro-esophageal reflux disease without esophagitis: Secondary | ICD-10-CM | POA: Diagnosis not present

## 2021-10-27 DIAGNOSIS — E785 Hyperlipidemia, unspecified: Secondary | ICD-10-CM | POA: Diagnosis not present

## 2021-10-27 DIAGNOSIS — R69 Illness, unspecified: Secondary | ICD-10-CM | POA: Diagnosis not present

## 2021-10-27 DIAGNOSIS — R779 Abnormality of plasma protein, unspecified: Secondary | ICD-10-CM | POA: Diagnosis not present

## 2021-11-22 ENCOUNTER — Telehealth: Payer: Self-pay | Admitting: Hematology and Oncology

## 2021-11-22 DIAGNOSIS — Z86010 Personal history of colonic polyps: Secondary | ICD-10-CM | POA: Diagnosis not present

## 2021-11-22 DIAGNOSIS — K219 Gastro-esophageal reflux disease without esophagitis: Secondary | ICD-10-CM | POA: Diagnosis not present

## 2021-11-22 NOTE — Telephone Encounter (Signed)
Scheduled appt per 9/18 referral. Pt is aware of appt date and time. Pt is aware to arrive 15 mins prior to appt time and to bring and updated insurance card. Pt is aware of appt location.   

## 2021-11-29 ENCOUNTER — Other Ambulatory Visit: Payer: Self-pay

## 2021-11-29 ENCOUNTER — Encounter (HOSPITAL_BASED_OUTPATIENT_CLINIC_OR_DEPARTMENT_OTHER): Payer: Self-pay

## 2021-11-29 ENCOUNTER — Emergency Department (HOSPITAL_BASED_OUTPATIENT_CLINIC_OR_DEPARTMENT_OTHER): Payer: 59

## 2021-11-29 ENCOUNTER — Inpatient Hospital Stay (HOSPITAL_BASED_OUTPATIENT_CLINIC_OR_DEPARTMENT_OTHER)
Admission: EM | Admit: 2021-11-29 | Discharge: 2021-12-02 | DRG: 393 | Disposition: A | Discharge status: Home / Self Care | Payer: 59 | Attending: Internal Medicine | Admitting: Internal Medicine

## 2021-11-29 DIAGNOSIS — Z87440 Personal history of urinary (tract) infections: Secondary | ICD-10-CM

## 2021-11-29 DIAGNOSIS — Z87442 Personal history of urinary calculi: Secondary | ICD-10-CM

## 2021-11-29 DIAGNOSIS — K529 Noninfective gastroenteritis and colitis, unspecified: Secondary | ICD-10-CM | POA: Diagnosis present

## 2021-11-29 DIAGNOSIS — T80818A Extravasation of other vesicant agent, initial encounter: Secondary | ICD-10-CM | POA: Diagnosis present

## 2021-11-29 DIAGNOSIS — Z6830 Body mass index (BMI) 30.0-30.9, adult: Secondary | ICD-10-CM

## 2021-11-29 DIAGNOSIS — E669 Obesity, unspecified: Secondary | ICD-10-CM | POA: Diagnosis present

## 2021-11-29 DIAGNOSIS — F32A Depression, unspecified: Secondary | ICD-10-CM | POA: Diagnosis present

## 2021-11-29 DIAGNOSIS — E7800 Pure hypercholesterolemia, unspecified: Secondary | ICD-10-CM | POA: Diagnosis present

## 2021-11-29 DIAGNOSIS — R000 Tachycardia, unspecified: Secondary | ICD-10-CM | POA: Diagnosis not present

## 2021-11-29 DIAGNOSIS — I10 Essential (primary) hypertension: Secondary | ICD-10-CM | POA: Diagnosis present

## 2021-11-29 DIAGNOSIS — K6389 Other specified diseases of intestine: Secondary | ICD-10-CM | POA: Diagnosis not present

## 2021-11-29 DIAGNOSIS — G8929 Other chronic pain: Secondary | ICD-10-CM | POA: Diagnosis present

## 2021-11-29 DIAGNOSIS — K219 Gastro-esophageal reflux disease without esophagitis: Secondary | ICD-10-CM | POA: Diagnosis present

## 2021-11-29 DIAGNOSIS — K760 Fatty (change of) liver, not elsewhere classified: Secondary | ICD-10-CM | POA: Diagnosis not present

## 2021-11-29 DIAGNOSIS — Z9884 Bariatric surgery status: Secondary | ICD-10-CM

## 2021-11-29 DIAGNOSIS — U071 COVID-19: Secondary | ICD-10-CM | POA: Diagnosis present

## 2021-11-29 DIAGNOSIS — N159 Renal tubulo-interstitial disease, unspecified: Secondary | ICD-10-CM | POA: Diagnosis present

## 2021-11-29 DIAGNOSIS — K559 Vascular disorder of intestine, unspecified: Secondary | ICD-10-CM | POA: Diagnosis not present

## 2021-11-29 DIAGNOSIS — Z86010 Personal history of colonic polyps: Secondary | ICD-10-CM

## 2021-11-29 DIAGNOSIS — D649 Anemia, unspecified: Secondary | ICD-10-CM | POA: Diagnosis present

## 2021-11-29 DIAGNOSIS — Z90710 Acquired absence of both cervix and uterus: Secondary | ICD-10-CM

## 2021-11-29 DIAGNOSIS — M549 Dorsalgia, unspecified: Secondary | ICD-10-CM | POA: Diagnosis present

## 2021-11-29 DIAGNOSIS — Z79899 Other long term (current) drug therapy: Secondary | ICD-10-CM

## 2021-11-29 DIAGNOSIS — K625 Hemorrhage of anus and rectum: Secondary | ICD-10-CM | POA: Diagnosis present

## 2021-11-29 DIAGNOSIS — F419 Anxiety disorder, unspecified: Secondary | ICD-10-CM | POA: Diagnosis present

## 2021-11-29 DIAGNOSIS — K922 Gastrointestinal hemorrhage, unspecified: Secondary | ICD-10-CM | POA: Diagnosis not present

## 2021-11-29 DIAGNOSIS — G4733 Obstructive sleep apnea (adult) (pediatric): Secondary | ICD-10-CM | POA: Diagnosis present

## 2021-11-29 DIAGNOSIS — N200 Calculus of kidney: Secondary | ICD-10-CM | POA: Diagnosis not present

## 2021-11-29 DIAGNOSIS — N390 Urinary tract infection, site not specified: Secondary | ICD-10-CM | POA: Diagnosis present

## 2021-11-29 DIAGNOSIS — Z887 Allergy status to serum and vaccine status: Secondary | ICD-10-CM

## 2021-11-29 LAB — COMPREHENSIVE METABOLIC PANEL
ALT: 21 U/L (ref 0–44)
AST: 41 U/L (ref 15–41)
Albumin: 3.7 g/dL (ref 3.5–5.0)
Alkaline Phosphatase: 61 U/L (ref 38–126)
Anion gap: 12 (ref 5–15)
BUN: 14 mg/dL (ref 8–23)
CO2: 20 mmol/L — ABNORMAL LOW (ref 22–32)
Calcium: 8.8 mg/dL — ABNORMAL LOW (ref 8.9–10.3)
Chloride: 100 mmol/L (ref 98–111)
Creatinine, Ser: 0.84 mg/dL (ref 0.44–1.00)
GFR, Estimated: >60 mL/min (ref >60)
Glucose, Bld: 151 mg/dL — ABNORMAL HIGH (ref 70–99)
Potassium: 3.4 mmol/L — ABNORMAL LOW (ref 3.5–5.1)
Sodium: 132 mmol/L — ABNORMAL LOW (ref 135–145)
Total Bilirubin: 0.6 mg/dL (ref 0.3–1.2)
Total Protein: 8.1 g/dL (ref 6.5–8.1)

## 2021-11-29 LAB — CBC
HCT: 40.9 % (ref 36.0–46.0)
Hemoglobin: 13.4 g/dL (ref 12.0–15.0)
MCH: 27.6 pg (ref 26.0–34.0)
MCHC: 32.8 g/dL (ref 30.0–36.0)
MCV: 84.3 fL (ref 80.0–100.0)
Platelets: 185 10*3/uL (ref 150–400)
RBC: 4.85 MIL/uL (ref 3.87–5.11)
RDW: 14.2 % (ref 11.5–15.5)
WBC: 8.6 10*3/uL (ref 4.0–10.5)
nRBC: 0 % (ref 0.0–0.2)

## 2021-11-29 LAB — LACTIC ACID, PLASMA: Lactic Acid, Venous: 1.5 mmol/L (ref 0.5–1.9)

## 2021-11-29 MED ORDER — LACTATED RINGERS IV SOLN: INTRAVENOUS | Status: DC

## 2021-11-29 MED ORDER — LACTATED RINGERS IV BOLUS
1000.0000 mL | Freq: Once | INTRAVENOUS | Status: AC
  Administered 2021-11-29: 1000 mL via INTRAVENOUS

## 2021-11-29 MED ORDER — IOHEXOL 350 MG/ML SOLN
100.0000 mL | Freq: Once | INTRAVENOUS | Status: AC | PRN
  Administered 2021-11-29: 100 mL via INTRAVENOUS

## 2021-11-29 NOTE — ED Triage Notes (Signed)
Tested positive for COVID on Friday. States she went to the restroom and fell in bathroom yesterday, became dizzy & diaphoretic; today having rectal bleeding. States syncopal episode yesterday.

## 2021-11-29 NOTE — ED Provider Notes (Signed)
[facility] EMERGENCY DEPARTMENT Provider Note   CSN: [id number] Arrival date & time: 11/29/21  1827     History  Chief Complaint  Patient presents with   Rectal Bleeding    COVID +    Tracey Powell is a 61 y.o. female.  HPI 61 year old female presents with a chief complaint of rectal bleeding.  She developed COVID about 3 to 4 days ago and has been having URI symptoms and lightheadedness.  Last night she felt abdominal cramping like she had to have diarrhea and so she stood up to go the bathroom.  She got lightheaded and fell to the ground.  She is not sure if she passed out or not.  After this she went to the bathroom, had a bowel movement and then flush the toilet but did not look at it.  Went back to lay on the couch.  Has been having on and off abdominal cramping since.  Today she had another bowel movement but this time it was pretty much just blood.  She is having some moderate upper abdominal pain.  She states her fevers are basically gone.  Has never had chest pain or shortness of breath.  She is not on blood thinners.  She reports a colonoscopy by Dr. Bosie Clos a few weeks ago that showed polyps that were removed. She feels like she's dehydrated.  Home Medications Prior to Admission medications   Medication Sig Start Date End Date Taking? Authorizing Provider  Cholecalciferol (VITAMIN D) 50 MCG (2000 UT) CAPS Take 2,000 Units by mouth daily.    [provider]  DULoxetine (CYMBALTA) 30 MG capsule Take 90 mg by mouth daily.    [provider]  [facility] Lanius Ultra Strength 1500 MG CAPS Take 1 capsule by mouth daily. Patient not taking: Reported on 02/02/2021    [provider]  magnesium oxide (MAG-OX) 400 MG tablet Take 400 mg by mouth daily.     [provider]  Multiple Vitamin (MULTIVITAMIN) tablet Take 1 tablet by mouth daily.      [provider]  Oxycodone HCl 10 MG TABS Take 10 mg by mouth as needed. Take 10 mg as needed  up to three times per day 01/10/12   [provider]  pantoprazole (PROTONIX) 40 MG tablet Take 1 tablet by mouth daily.    [provider]  simvastatin (ZOCOR) 20 MG tablet Take 1 tablet by mouth daily.    [provider]  sodium chloride (OCEAN) 0.65 % SOLN nasal spray Place 1 spray into both nostrils as needed for congestion. 02/02/21   Quintella Reichert, MD  tamsulosin (FLOMAX) 0.4 MG CAPS capsule Take 1 capsule (0.4 mg total) by mouth daily. Patient not taking: Reported on 02/02/2021 07/11/19   Rene Paci, MD  vitamin B-12 (CYANOCOBALAMIN) 1000 MCG tablet Take 1,000 mcg by mouth daily.    [provider]      Allergies    Shingrix [zoster vac recomb adjuvanted]    Review of Systems   Review of Systems  Constitutional:  Negative for fever.  Respiratory:  Negative for shortness of breath.   Cardiovascular:  Negative for chest pain.  Gastrointestinal:  Positive for abdominal pain, blood in stool and diarrhea. Negative for rectal pain.  Neurological:  Positive for light-headedness.    Physical Exam Updated Vital Signs BP 113/86   Pulse 84   Temp 99.5 F (37.5 C) (Oral)   Resp (!) 22   Ht 5\' 5"  (1.651  m)   Wt 74.4 kg   SpO2 99%   BMI 27.29 kg/m  Physical Exam Vitals and nursing note reviewed.  Constitutional:      General: She is not in acute distress.    Appearance: She is well-developed. She is not ill-appearing or diaphoretic.  HENT:     Head: Normocephalic and atraumatic.  Cardiovascular:     Rate and Rhythm: Normal rate and regular rhythm.     Heart sounds: Normal heart sounds.  Pulmonary:     Effort: Pulmonary effort is normal.     Breath sounds: Normal breath sounds.  Abdominal:     Palpations: Abdomen is soft.     Tenderness: There is abdominal tenderness in the right upper quadrant, epigastric area and left upper quadrant.  Genitourinary:      Comments: Small amount of BRBPR on digital exam. Skin:    General:  Skin is warm and dry.  Neurological:     Mental Status: She is alert.     ED Results / Procedures / Treatments   Labs (all labs ordered are listed, but only abnormal results are displayed) Labs Reviewed  COMPREHENSIVE METABOLIC PANEL - Abnormal; Notable for the following components:      Result Value   Sodium 132 (*)    Potassium 3.4 (*)    CO2 20 (*)    Glucose, Bld 151 (*)    Calcium 8.8 (*)    All other components within normal limits  SARS CORONAVIRUS 2 BY RT PCR  CBC  LACTIC ACID, PLASMA  POC OCCULT BLOOD, ED    EKG EKG Interpretation  Date/Time:  Monday November 29 2021 18:57:51 EDT Ventricular Rate:  112 PR Interval:  150 QRS Duration: 78 QT Interval:  330 QTC Calculation: 450 R Axis:   -59 Text Interpretation: Sinus tachycardia Left anterior fascicular block Minimal voltage criteria for LVH, may be normal variant ( R in aVL ) Anterolateral infarct , age undetermined Confirmed by Sherwood Gambler [phone]) on 11/29/2021 8:38:54 PM  Radiology CT Angio Abd/Pel W and/or Wo Contrast  Result Date: 11/29/2021 CLINICAL DATA:  GI bleed, lower. Rectal bleeding today with syncope. EXAM: CTA ABDOMEN AND PELVIS WITHOUT AND WITH CONTRAST TECHNIQUE: Multidetector CT imaging of the abdomen and pelvis was performed using the standard protocol during bolus administration of intravenous contrast. Multiplanar reconstructed images and MIPs were obtained and reviewed to evaluate the vascular anatomy. RADIATION DOSE REDUCTION: This exam was performed according to the departmental dose-optimization program which includes automated exposure control, adjustment of the mA and/or kV according to patient size and/or use of iterative reconstruction technique. CONTRAST:  147mL OMNIPAQUE IOHEXOL 350 MG/ML SOLN COMPARISON:  07/29/2016. FINDINGS: VASCULAR Aorta: Normal caliber aorta without aneurysm, dissection, vasculitis or significant stenosis. Celiac: Patent without evidence of aneurysm, dissection,  vasculitis or significant stenosis. SMA: Patent without evidence of aneurysm, dissection, vasculitis or significant stenosis. Renals: Both renal arteries are patent without evidence of aneurysm, dissection, vasculitis, fibromuscular dysplasia or significant stenosis. IMA: Patent without evidence of aneurysm, dissection, vasculitis or significant stenosis. Hyperemia is noted involving and distal branch of the IMA with prominent IMV in the region of the mid to distal descending colon. Inflow: Patent without evidence of aneurysm, dissection, vasculitis or significant stenosis. Proximal Outflow: Bilateral common femoral and visualized portions of the superficial and profunda femoral arteries are patent without evidence of aneurysm, dissection, vasculitis or significant stenosis. Veins: The anterior mesenteric vein is prominent in the region of the mid to distal descending colon. Review  of the MIP images confirms the above findings. NON-VASCULAR Lower chest: Heart is normal in size. A calcified granuloma is noted in the right lower lobe. Minimal atelectasis or scarring at the lung bases. Hepatobiliary: No focal abnormality. Hepatic steatosis is noted. There is subtle nodularity of the inferior aspect of the liver, possible underlying cirrhosis. No biliary ductal dilatation. Gallbladder is without stones. Pancreas: Unremarkable. No pancreatic ductal dilatation or surrounding inflammatory changes. Spleen: Normal size. A small hypervascular focus is present in the spleen measuring 8 mm, likely hemangioma. Adrenals/Urinary Tract: The adrenal glands are within normal limits. A cyst is present in the mid left kidney. Renal cortical scarring is noted bilaterally. Bilateral renal calculi are noted without evidence of obstructive uropathy. The bladder the bladder is unremarkable. Stomach/Bowel: A gastric lap band is noted, with moderate herniation of the gastric pouch and lap band. No bowel obstruction, free air, or pneumatosis.  No focal hemorrhage is identified. There is bowel wall thickening with surrounding inflammatory changes involving the descending colon. There is a long segment of hyperdense attenuation involving the segment of colon, concerning for contrast extravasation. Hyperemia is noted at the mid to distal descending colon involving a branch of the IMA. Normal appendix is seen in the right lower quadrant. Lymphatic: No abdominal or pelvic lymphadenopathy. Reproductive: Status post hysterectomy. No adnexal masses. Other: No abdominopelvic ascites. Musculoskeletal: Degenerative changes in the thoracolumbar spine. No acute osseous abnormality. IMPRESSION: VASCULAR 1. No significant arterial stenosis, aneurysm, or occlusion. 2. Hyperemia involving distal segment of the IMA and prominent IMV in the region the mid to distal descending colon. NON-VASCULAR 1. Colonic wall thickening with surrounding fat stranding involving the mid to distal descending colon, compatible with colitis. Hyperemia is noted involving a branch of the IMA and prominent IMV in this region. Long segment of hyperdense attenuation is noted in the mid to distal descending colon on arterial and venous phases, concerning for contrast extravasation and possible active hemorrhage. Conventional angiography may be beneficial for further evaluation. 2. 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 3. Hepatic steatosis. Mild nodularity of the inferior aspect of the liver may be associated with underlying cirrhosis. 4. Bilateral nephrolithiasis. 5. Remaining incidental findings as described above. Electronically Signed   By: Thornell Sartorius M.D.   On: 11/29/2021 23:00    Procedures .Critical Care  Performed by: Pricilla Loveless, MD Authorized by: Pricilla Loveless, MD   Critical care provider statement:    Critical care time (minutes):  30   Critical care time was exclusive of:  Separately billable procedures and treating other  patients   Critical care was necessary to treat or prevent imminent or life-threatening deterioration of the following conditions:  Circulatory failure   Critical care was time spent personally by me on the following activities:  Development of treatment plan with patient or surrogate, discussions with consultants, evaluation of patient's response to treatment, examination of patient, ordering and review of laboratory studies, ordering and review of radiographic studies, ordering and performing treatments and interventions, pulse oximetry, re-evaluation of patient's condition and review of old charts     Medications Ordered in ED Medications  lactated ringers infusion ( Intravenous New Bag/Given 11/29/21 2346)  lactated ringers bolus 1,000 mL (0 mLs Intravenous Stopped 11/29/21 2248)  lactated ringers bolus 1,000 mL (0 mLs Intravenous Stopped 11/29/21 2248)  iohexol (OMNIPAQUE) 350 MG/ML injection 100 mL (100 mLs Intravenous Contrast Given 11/29/21 2202)    ED Course/ Medical Decision Making/  A&P                           Medical Decision Making Amount and/or Complexity of Data Reviewed Labs: ordered.    Details: Hemoglobin normal at 13.4.  Normal lactate.  Mildly low potassium. Radiology: ordered and independent interpretation performed.    Details: Colitis seen on CT.  Risk Prescription drug management.   Patient has declined pain medicine.  While in the emergency department she did have another episode of rectal bleeding.  CTA obtained and I have viewed/interpreted the images.  She has colitis.  Radiology is concerned about possible active extract.  Discussed with Dr. Randie Heinz of vascular and later Dr. Lowella Dandy of IR.  Point, neither feels like she needs an emergent intervention such as angiography or surgery.  Think this is likely more colitis than it is active extra half.  This seems to be mostly an ischemic colitis.  She was given IV fluid boluses and now will be put on a maintenance drip to  help with hydration. I discussed with Dr. Ewing Schlein, who agrees this is probably ischemic, no antibiotics at this time. GI will consult.  Will admit to hospitalist service.        Final Clinical Impression(s) / ED Diagnoses Final diagnoses:  Ischemic colitis [facility])    Rx / DC Orders ED Discharge Orders     None         Pricilla Loveless, MD 11/29/21 2356

## 2021-11-30 DIAGNOSIS — R69 Illness, unspecified: Secondary | ICD-10-CM | POA: Diagnosis not present

## 2021-11-30 DIAGNOSIS — F32A Depression, unspecified: Secondary | ICD-10-CM | POA: Diagnosis not present

## 2021-11-30 DIAGNOSIS — K559 Vascular disorder of intestine, unspecified: Secondary | ICD-10-CM | POA: Diagnosis not present

## 2021-11-30 DIAGNOSIS — N390 Urinary tract infection, site not specified: Secondary | ICD-10-CM | POA: Diagnosis not present

## 2021-11-30 DIAGNOSIS — E7800 Pure hypercholesterolemia, unspecified: Secondary | ICD-10-CM

## 2021-11-30 DIAGNOSIS — K625 Hemorrhage of anus and rectum: Secondary | ICD-10-CM | POA: Diagnosis not present

## 2021-11-30 DIAGNOSIS — F419 Anxiety disorder, unspecified: Secondary | ICD-10-CM | POA: Diagnosis not present

## 2021-11-30 DIAGNOSIS — K219 Gastro-esophageal reflux disease without esophagitis: Secondary | ICD-10-CM | POA: Diagnosis not present

## 2021-11-30 DIAGNOSIS — G4733 Obstructive sleep apnea (adult) (pediatric): Secondary | ICD-10-CM | POA: Diagnosis not present

## 2021-11-30 DIAGNOSIS — K529 Noninfective gastroenteritis and colitis, unspecified: Secondary | ICD-10-CM

## 2021-11-30 DIAGNOSIS — E669 Obesity, unspecified: Secondary | ICD-10-CM | POA: Diagnosis not present

## 2021-11-30 DIAGNOSIS — Z6830 Body mass index (BMI) 30.0-30.9, adult: Secondary | ICD-10-CM | POA: Diagnosis not present

## 2021-11-30 DIAGNOSIS — Z9884 Bariatric surgery status: Secondary | ICD-10-CM | POA: Diagnosis not present

## 2021-11-30 DIAGNOSIS — K922 Gastrointestinal hemorrhage, unspecified: Secondary | ICD-10-CM

## 2021-11-30 DIAGNOSIS — Z87440 Personal history of urinary (tract) infections: Secondary | ICD-10-CM | POA: Diagnosis not present

## 2021-11-30 DIAGNOSIS — Z86010 Personal history of colonic polyps: Secondary | ICD-10-CM | POA: Diagnosis not present

## 2021-11-30 DIAGNOSIS — T80818A Extravasation of other vesicant agent, initial encounter: Secondary | ICD-10-CM | POA: Diagnosis not present

## 2021-11-30 DIAGNOSIS — I10 Essential (primary) hypertension: Secondary | ICD-10-CM | POA: Diagnosis not present

## 2021-11-30 DIAGNOSIS — Z90710 Acquired absence of both cervix and uterus: Secondary | ICD-10-CM | POA: Diagnosis not present

## 2021-11-30 DIAGNOSIS — D649 Anemia, unspecified: Secondary | ICD-10-CM | POA: Diagnosis not present

## 2021-11-30 DIAGNOSIS — Z887 Allergy status to serum and vaccine status: Secondary | ICD-10-CM | POA: Diagnosis not present

## 2021-11-30 DIAGNOSIS — Z87442 Personal history of urinary calculi: Secondary | ICD-10-CM | POA: Diagnosis not present

## 2021-11-30 DIAGNOSIS — G8929 Other chronic pain: Secondary | ICD-10-CM | POA: Diagnosis not present

## 2021-11-30 DIAGNOSIS — U071 COVID-19: Secondary | ICD-10-CM | POA: Diagnosis not present

## 2021-11-30 DIAGNOSIS — Z79899 Other long term (current) drug therapy: Secondary | ICD-10-CM | POA: Diagnosis not present

## 2021-11-30 DIAGNOSIS — M549 Dorsalgia, unspecified: Secondary | ICD-10-CM | POA: Diagnosis not present

## 2021-11-30 LAB — CBC
HCT: 34.6 % — ABNORMAL LOW (ref 36.0–46.0)
HCT: 35 % — ABNORMAL LOW (ref 36.0–46.0)
Hemoglobin: 11.4 g/dL — ABNORMAL LOW (ref 12.0–15.0)
Hemoglobin: 11.5 g/dL — ABNORMAL LOW (ref 12.0–15.0)
MCH: 27.6 pg (ref 26.0–34.0)
MCH: 27.7 pg (ref 26.0–34.0)
MCHC: 32.9 g/dL (ref 30.0–36.0)
MCHC: 32.9 g/dL (ref 30.0–36.0)
MCV: 83.9 fL (ref 80.0–100.0)
MCV: 84.2 fL (ref 80.0–100.0)
Platelets: 126 10*3/uL — ABNORMAL LOW (ref 150–400)
Platelets: 144 10*3/uL — ABNORMAL LOW (ref 150–400)
RBC: 4.11 MIL/uL (ref 3.87–5.11)
RBC: 4.17 MIL/uL (ref 3.87–5.11)
RDW: 14.2 % (ref 11.5–15.5)
RDW: 14.2 % (ref 11.5–15.5)
WBC: 7.3 10*3/uL (ref 4.0–10.5)
WBC: 8.8 10*3/uL (ref 4.0–10.5)
nRBC: 0 % (ref 0.0–0.2)
nRBC: 0 % (ref 0.0–0.2)

## 2021-11-30 LAB — BASIC METABOLIC PANEL
Anion gap: 5 (ref 5–15)
BUN: 8 mg/dL (ref 8–23)
CO2: 28 mmol/L (ref 22–32)
Calcium: 8.3 mg/dL — ABNORMAL LOW (ref 8.9–10.3)
Chloride: 105 mmol/L (ref 98–111)
Creatinine, Ser: 0.62 mg/dL (ref 0.44–1.00)
GFR, Estimated: >60 mL/min (ref >60)
Glucose, Bld: 96 mg/dL (ref 70–99)
Potassium: 3.3 mmol/L — ABNORMAL LOW (ref 3.5–5.1)
Sodium: 138 mmol/L (ref 135–145)

## 2021-11-30 LAB — SARS CORONAVIRUS 2 BY RT PCR: SARS Coronavirus 2 by RT PCR: POSITIVE — AB

## 2021-11-30 LAB — LACTIC ACID, PLASMA: Lactic Acid, Venous: 0.8 mmol/L (ref 0.5–1.9)

## 2021-11-30 LAB — APTT: aPTT: 30 seconds (ref 24–36)

## 2021-11-30 LAB — PROTIME-INR
INR: 1.2 (ref 0.8–1.2)
Prothrombin Time: 15.5 seconds — ABNORMAL HIGH (ref 11.4–15.2)

## 2021-11-30 MED ORDER — ACETAMINOPHEN 650 MG RE SUPP: 650.0000 mg | Freq: Four times a day (QID) | RECTAL | Status: DC | PRN

## 2021-11-30 MED ORDER — FAMOTIDINE 20 MG PO TABS
20.0000 mg | ORAL_TABLET | Freq: Every day | ORAL | Status: DC
  Administered 2021-11-30 – 2021-12-01 (×2): 20 mg via ORAL
  Filled 2021-11-30 (×2): qty 1

## 2021-11-30 MED ORDER — METHENAMINE MANDELATE 0.5 G PO TABS
1000.0000 mg | ORAL_TABLET | Freq: Four times a day (QID) | ORAL | Status: DC
  Administered 2021-11-30 – 2021-12-02 (×6): 1000 mg via ORAL
  Filled 2021-11-30 (×8): qty 2

## 2021-11-30 MED ORDER — SIMVASTATIN 20 MG PO TABS
20.0000 mg | ORAL_TABLET | Freq: Every day | ORAL | Status: DC
  Administered 2021-12-01 – 2021-12-02 (×2): 20 mg via ORAL
  Filled 2021-11-30 (×2): qty 1

## 2021-11-30 MED ORDER — ACETAMINOPHEN 325 MG PO TABS
650.0000 mg | ORAL_TABLET | Freq: Four times a day (QID) | ORAL | Status: DC | PRN
  Administered 2021-12-01 – 2021-12-02 (×3): 650 mg via ORAL
  Filled 2021-11-30 (×3): qty 2

## 2021-11-30 MED ORDER — PANTOPRAZOLE SODIUM 40 MG PO TBEC
40.0000 mg | DELAYED_RELEASE_TABLET | Freq: Two times a day (BID) | ORAL | Status: DC
  Administered 2021-11-30 – 2021-12-02 (×4): 40 mg via ORAL
  Filled 2021-11-30 (×4): qty 1

## 2021-11-30 MED ORDER — SODIUM CHLORIDE 0.9% FLUSH
3.0000 mL | Freq: Two times a day (BID) | INTRAVENOUS | Status: DC
  Administered 2021-12-01 – 2021-12-02 (×2): 3 mL via INTRAVENOUS

## 2021-11-30 MED ORDER — METHENAMINE HIPPURATE 1 G PO TABS: 1.0000 g | ORAL_TABLET | Freq: Two times a day (BID) | ORAL | Status: DC

## 2021-11-30 MED ORDER — LOSARTAN POTASSIUM 50 MG PO TABS
25.0000 mg | ORAL_TABLET | Freq: Every day | ORAL | Status: DC
  Administered 2021-12-01 – 2021-12-02 (×2): 25 mg via ORAL
  Filled 2021-11-30 (×2): qty 1

## 2021-11-30 MED ORDER — DULOXETINE HCL 30 MG PO CPEP
90.0000 mg | ORAL_CAPSULE | Freq: Every day | ORAL | Status: DC
  Administered 2021-12-01 – 2021-12-02 (×2): 90 mg via ORAL
  Filled 2021-11-30 (×2): qty 3

## 2021-11-30 NOTE — ED Notes (Signed)
Provider notified of pt cramping and rectal bleeding.

## 2021-11-30 NOTE — Progress Notes (Signed)
Patient arrived to floor via EMS pt call bell within reach. Pt denies pain or needs.

## 2021-11-30 NOTE — ED Notes (Signed)
ED TO INPATIENT HANDOFF REPORT  ED Nurse Name and Phone #: Baxter Flattery, RN  S Name/Age/Gender Tracey Powell 61 y.o. female Room/Bed: MH12/MH12  Code Status   Code Status: Not on file  Home/SNF/Other Home Patient oriented to: self, place, time, and situation Is this baseline? Yes   Triage Complete: Triage complete  Chief Complaint Acute lower GI bleeding [K92.2]  Triage Note Tested positive for COVID on Friday. States she went to the restroom and fell in bathroom yesterday, became dizzy & diaphoretic; today having rectal bleeding. States syncopal episode yesterday.    Allergies Allergies  Allergen Reactions   Shingrix [Zoster Vac Recomb Adjuvanted] Swelling    Level of Care/Admitting Diagnosis ED Disposition     ED Disposition  Admit   Condition  --   Comment  Hospital Area: [facility] [id number]  Level of Care: Telemetry [5]  Admit to tele based on following criteria: Monitor for Ischemic changes  Interfacility transfer: Yes  May place patient in observation at [facility] or [facility] if equivalent level of care is available:: Yes  Covid Evaluation: Asymptomatic - no recent exposure (last 10 days) testing not required  Diagnosis: Acute lower GI bleeding [id number]  Admitting Physician: Vernelle Emerald [id number]  Attending Physician: Vernelle Emerald [id number]          B Medical/Surgery History Past Medical History:  Diagnosis Date   Acid reflux    Anxiety    Chronic back pain    Complication of anesthesia    slow to wake up   Depressed    DJD (degenerative joint disease)    Hypercholesteremia    Hypertension    Kidney infection    Nephrolithiasis    OSA (obstructive sleep apnea)    mild with AHI 13/hr now on CPAP at 11cm H2O   Urolithiasis    Past Surgical History:  Procedure Laterality Date   ABDOMINAL HYSTERECTOMY     ELBOW SURGERY     EXTRACORPOREAL SHOCK WAVE LITHOTRIPSY Right 07/11/2019   Procedure: EXTRACORPOREAL SHOCK  WAVE LITHOTRIPSY (ESWL);  Surgeon: Ceasar Mons, MD;  Location: [facility];  Service: Urology;  Laterality: Right;   EXTRACORPOREAL SHOCK WAVE LITHOTRIPSY Left 08/01/2019   Procedure: LEFT EXTRACORPOREAL SHOCK WAVE LITHOTRIPSY (ESWL);  Surgeon: Festus Aloe, MD;  Location: [facility];  Service: Urology;  Laterality: Left;   KNEE SURGERY     LAPAROSCOPIC GASTRIC BANDING     LITHOTRIPSY       A IV Location/Drains/Wounds Patient Lines/Drains/Airways Status     Active Line/Drains/Airways     Name Placement date Placement time Site Days   Peripheral IV 11/29/21 20 G 1" Anterior;Right Forearm 11/29/21  2121  Forearm  1            Intake/Output Last 24 hours No intake or output data in the 24 hours ending 11/30/21 1418  Labs/Imaging Results for orders placed or performed during the hospital encounter of 11/29/21 (from the past 48 hour(s))  Comprehensive metabolic panel     Status: Abnormal   Collection Time: 11/29/21  7:03 PM  Result Value Ref Range   Sodium 132 (L) 135 - 145 mmol/L   Potassium 3.4 (L) 3.5 - 5.1 mmol/L   Chloride 100 98 - 111 mmol/L   CO2 20 (L) 22 - 32 mmol/L   Glucose, Bld 151 (H) 70 - 99 mg/dL    Comment: Glucose reference range applies only to samples taken after fasting for at least  8 hours.   BUN 14 8 - 23 mg/dL   Creatinine, Ser 0.84 0.44 - 1.00 mg/dL   Calcium 8.8 (L) 8.9 - 10.3 mg/dL   Total Protein 8.1 6.5 - 8.1 g/dL   Albumin 3.7 3.5 - 5.0 g/dL   AST 41 15 - 41 U/L   ALT 21 0 - 44 U/L   Alkaline Phosphatase 61 38 - 126 U/L   Total Bilirubin 0.6 0.3 - 1.2 mg/dL   GFR, Estimated >60 >60 mL/min    Comment: (NOTE) Calculated using the CKD-EPI Creatinine Equation (2021)    Anion gap 12 5 - 15    Comment: Performed at [facility], [address] [place], [place] [address]  CBC     Status: None   Collection Time: 11/29/21  7:03 PM  Result Value Ref Range   WBC 8.6 4.0 - 10.5 K/uL   RBC  4.85 3.87 - 5.11 MIL/uL   Hemoglobin 13.4 12.0 - 15.0 g/dL   HCT 40.9 36.0 - 46.0 %   MCV 84.3 80.0 - 100.0 fL   MCH 27.6 26.0 - 34.0 pg   MCHC 32.8 30.0 - 36.0 g/dL   RDW 14.2 11.5 - 15.5 %   Platelets 185 150 - 400 K/uL   nRBC 0.0 0.0 - 0.2 %    Comment: Performed at [facility], [address] [place], [place] [address]  Lactic acid, plasma     Status: None   Collection Time: 11/29/21  9:13 PM  Result Value Ref Range   Lactic Acid, Venous 1.5 0.5 - 1.9 mmol/L    Comment: Performed at [facility], [address] [place], [place] [address]  SARS Coronavirus 2 by RT PCR (hospital order, performed in [facility] hospital lab) *cepheid single result test* Anterior Nasal Swab     Status: Abnormal   Collection Time: 11/29/21 11:47 PM   Specimen: Anterior Nasal Swab  Result Value Ref Range   SARS Coronavirus 2 by RT PCR POSITIVE (A) NEGATIVE    Comment: (NOTE) SARS-CoV-2 target nucleic acids are DETECTED  SARS-CoV-2 RNA is generally detectable in upper respiratory specimens  during the acute phase of infection.  Positive results are indicative  of the presence of the identified virus, but do not rule out bacterial infection or co-infection with other pathogens not detected by the test.  Clinical correlation with patient history and  other diagnostic information is necessary to determine patient infection status.  The expected result is negative.  Fact Sheet for Patients:   https://www.patel.info/   Fact Sheet for Healthcare Providers:   https://hall.com/    This test is not yet approved or cleared by the [place] FDA and  has been authorized for detection and/or diagnosis of SARS-CoV-2 by FDA under an Emergency Use Authorization (EUA).  This EUA will remain in effect (meaning this test can be used) for the duration of  the COVID-19 declaration under Section 564(b)(1)  of the Act, 21 U.S.C. section  360-bbb-3(b)(1), unless the authorization is terminated or revoked sooner.   Performed at [facility], [address] [place], [place] [address]   CBC     Status: Abnormal   Collection Time: 11/30/21 12:17 AM  Result Value Ref Range   WBC 8.8 4.0 - 10.5 K/uL   RBC 4.11 3.87 - 5.11 MIL/uL   Hemoglobin 11.4 (L) 12.0 - 15.0 g/dL   HCT 34.6 (L) 36.0 - 46.0 %  MCV 84.2 80.0 - 100.0 fL   MCH 27.7 26.0 - 34.0 pg   MCHC 32.9 30.0 - 36.0 g/dL   RDW 14.2 11.5 - 15.5 %   Platelets 144 (L) 150 - 400 K/uL   nRBC 0.0 0.0 - 0.2 %    Comment: Performed at [facility], [address] [place], [place] [address]  Protime-INR     Status: Abnormal   Collection Time: 11/30/21 12:17 AM  Result Value Ref Range   Prothrombin Time 15.5 (H) 11.4 - 15.2 seconds   INR 1.2 0.8 - 1.2    Comment: (NOTE) INR goal varies based on device and disease states. Performed at [facility], [address] [place], [place] [address]   APTT     Status: None   Collection Time: 11/30/21 12:17 AM  Result Value Ref Range   aPTT 30 24 - 36 seconds    Comment: Performed at [facility], [address] [place], [place] [address]  CBC     Status: Abnormal   Collection Time: 11/30/21 10:47 AM  Result Value Ref Range   WBC 7.3 4.0 - 10.5 K/uL   RBC 4.17 3.87 - 5.11 MIL/uL   Hemoglobin 11.5 (L) 12.0 - 15.0 g/dL   HCT 35.0 (L) 36.0 - 46.0 %   MCV 83.9 80.0 - 100.0 fL   MCH 27.6 26.0 - 34.0 pg   MCHC 32.9 30.0 - 36.0 g/dL   RDW 14.2 11.5 - 15.5 %   Platelets 126 (L) 150 - 400 K/uL    Comment: REPEATED TO VERIFY   nRBC 0.0 0.0 - 0.2 %    Comment: Performed at [facility], [address] [place], [place] [address]   CT Angio Abd/Pel W and/or Wo Contrast  Result Date: 11/29/2021 CLINICAL DATA:  GI bleed, lower. Rectal bleeding today with syncope. EXAM: CTA ABDOMEN AND PELVIS WITHOUT AND WITH CONTRAST TECHNIQUE: Multidetector CT imaging of the abdomen and  pelvis was performed using the standard protocol during bolus administration of intravenous contrast. Multiplanar reconstructed images and MIPs were obtained and reviewed to evaluate the vascular anatomy. RADIATION DOSE REDUCTION: This exam was performed according to the departmental dose-optimization program which includes automated exposure control, adjustment of the mA and/or kV according to patient size and/or use of iterative reconstruction technique. CONTRAST:  162mL OMNIPAQUE IOHEXOL 350 MG/ML SOLN COMPARISON:  07/29/2016. FINDINGS: VASCULAR Aorta: Normal caliber aorta without aneurysm, dissection, vasculitis or significant stenosis. Celiac: Patent without evidence of aneurysm, dissection, vasculitis or significant stenosis. SMA: Patent without evidence of aneurysm, dissection, vasculitis or significant stenosis. Renals: Both renal arteries are patent without evidence of aneurysm, dissection, vasculitis, fibromuscular dysplasia or significant stenosis. IMA: Patent without evidence of aneurysm, dissection, vasculitis or significant stenosis. Hyperemia is noted involving and distal branch of the IMA with prominent IMV in the region of the mid to distal descending colon. Inflow: Patent without evidence of aneurysm, dissection, vasculitis or significant stenosis. Proximal Outflow: Bilateral common femoral and visualized portions of the superficial and profunda femoral arteries are patent without evidence of aneurysm, dissection, vasculitis or significant stenosis. Veins: The anterior mesenteric vein is prominent in the region of the mid to distal descending colon. Review of the MIP images confirms the above findings. NON-VASCULAR Lower chest: Heart is normal in size. A calcified granuloma is noted in the right lower lobe. Minimal atelectasis or scarring at the lung bases. Hepatobiliary: No focal abnormality. Hepatic steatosis  is noted. There is subtle nodularity of the inferior aspect of the liver, possible  underlying cirrhosis. No biliary ductal dilatation. Gallbladder is without stones. Pancreas: Unremarkable. No pancreatic ductal dilatation or surrounding inflammatory changes. Spleen: Normal size. A small hypervascular focus is present in the spleen measuring 8 mm, likely hemangioma. Adrenals/Urinary Tract: The adrenal glands are within normal limits. A cyst is present in the mid left kidney. Renal cortical scarring is noted bilaterally. Bilateral renal calculi are noted without evidence of obstructive uropathy. The bladder the bladder is unremarkable. Stomach/Bowel: A gastric lap band is noted, with moderate herniation of the gastric pouch and lap band. No bowel obstruction, free air, or pneumatosis. No focal hemorrhage is identified. There is bowel wall thickening with surrounding inflammatory changes involving the descending colon. There is a long segment of hyperdense attenuation involving the segment of colon, concerning for contrast extravasation. Hyperemia is noted at the mid to distal descending colon involving a branch of the IMA. Normal appendix is seen in the right lower quadrant. Lymphatic: No abdominal or pelvic lymphadenopathy. Reproductive: Status post hysterectomy. No adnexal masses. Other: No abdominopelvic ascites. Musculoskeletal: Degenerative changes in the thoracolumbar spine. No acute osseous abnormality. IMPRESSION: VASCULAR 1. No significant arterial stenosis, aneurysm, or occlusion. 2. Hyperemia involving distal segment of the IMA and prominent IMV in the region the mid to distal descending colon. NON-VASCULAR 1. Colonic wall thickening with surrounding fat stranding involving the mid to distal descending colon, compatible with colitis. Hyperemia is noted involving a branch of the IMA and prominent IMV in this region. Long segment of hyperdense attenuation is noted in the mid to distal descending colon on arterial and venous phases, concerning for contrast extravasation and possible active  hemorrhage. Conventional angiography may be beneficial for further evaluation. 2. 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 3. Hepatic steatosis. Mild nodularity of the inferior aspect of the liver may be associated with underlying cirrhosis. 4. Bilateral nephrolithiasis. 5. Remaining incidental findings as described above. Electronically Signed   By: Brett Fairy M.D.   On: 11/29/2021 23:00    Pending Labs Unresulted Labs (From admission, onward)    None       Vitals/Pain Today's Vitals   11/30/21 0900 11/30/21 1000 11/30/21 1119 11/30/21 1120  BP: 106/71 108/66  117/77  Pulse: 76 73  73  Resp: 17 12  14   Temp:   99.2 F (37.3 C)   TempSrc:      SpO2: 96% 96%  97%  Weight:      Height:      PainSc:        Isolation Precautions No active isolations  Medications Medications  lactated ringers infusion ( Intravenous New Bag/Given 11/30/21 0823)  lactated ringers bolus 1,000 mL (0 mLs Intravenous Stopped 11/29/21 2248)  lactated ringers bolus 1,000 mL (0 mLs Intravenous Stopped 11/29/21 2248)  iohexol (OMNIPAQUE) 350 MG/ML injection 100 mL (100 mLs Intravenous Contrast Given 11/29/21 2202)    Mobility walks Low fall risk   Focused Assessments Cardiac Assessment Handoff:    No results found for: "CKTOTAL", "CKMB", "CKMBINDEX", "TROPONINI" No results found for: "DDIMER" Does the Patient currently have chest pain? No    R Recommendations: See Admitting Provider Note  Report given to:   Additional Notes:

## 2021-11-30 NOTE — ED Notes (Addendum)
First contact with Patient. Patient waiting on transport to facility at this time. Awaiting transport. Patient updated on plan of care. No acute distress noted. Will continue to monitor.  1710: Patient up to restroom without incident.   1820: [facility] at bedside - Report given to [facility]. No acute distress noted upon this RN's departure of Patient.

## 2021-11-30 NOTE — H&P (Addendum)
History and Physical   Tracey Powell UJW:[id number] DOB: [birth year] DOA: 11/29/2021  PCP: Merri Brunette, MD   Patient coming from: Home  Chief Complaint: Rectal bleeding  HPI: Tracey Powell is a 61 y.o. female with medical history significant of obesity, status post lap band, hypertension, hyperlipidemia, GERD, depression, OSA presenting with rectal bleeding.  Patient with known COVID-19 infection for the past several days.  Initially had just URI symptoms with some associated lightheadedness.  The day prior to her presentation she had some abdominal cramping and went to go to the bathroom but she became lightheaded on standing and fell to the ground, does not recall fully passing out.  She then went to the bathroom and had a bowel movement that she did not look at.  She had intermittent abdominal cramping following this and then the day of presentation she went to the bathroom and had a bowel movement that seem to be largely blood.  She reports having a colonoscopy a few weeks ago with some polyps removed.  She denies fevers, chills, chest pain, shortness of breath, constipation, nausea, vomiting.  She is feeling better after initial inventions at the med center prior to transfer.  ED Course: Vital signs in the ED stable.  Lab work-up included CMP with sodium 132, potassium 3.4, bicarb 20, glucose 151, calcium 8.8.  CBC initially with normal hemoglobin of 13.4 but while in the ED this was repeated and noted to have subsequent hemoglobin of 11.4 and 11.5.  PT mildly elevated at 1.5 and PTT and INR normal.  FOBT positive.  Lactic acid normal.  COVID screening positive for COVID.  CTA of the abdomen pelvis showed colonic wall thickening with fat stranding consistent with colitis also noted was possible contrast extravasation that could represent active bleed.  Patient noted to be status post lap band and noted to have a hiatal hernia that did contain the stomach and lap band, renal stones also  noted.  Patient received 2 L of IV fluids as well as placed on a rate of fluids in the ED.  Vascular surgery and IR were consulted concerning this possible contrast extravasation/bleeding they doubt there is true extravasation and there may have been an over read suspect her bleeding is likely due to the colitis that was noted.  GI was consulted and suspects she might have a degree of ischemic colitis and recommended against antibiotics in favor of IV fluids.  Review of Systems: As per HPI otherwise all other systems reviewed and are negative.  Past Medical History:  Diagnosis Date   Acid reflux    Anxiety    Chronic back pain    Complication of anesthesia    slow to wake up   Depressed    DJD (degenerative joint disease)    Hypercholesteremia    Hypertension    Kidney infection    Nephrolithiasis    OSA (obstructive sleep apnea)    mild with AHI 13/hr now on CPAP at 11cm H2O   Urolithiasis     Past Surgical History:  Procedure Laterality Date   ABDOMINAL HYSTERECTOMY     ELBOW SURGERY     EXTRACORPOREAL SHOCK WAVE LITHOTRIPSY Right 07/11/2019   Procedure: EXTRACORPOREAL SHOCK WAVE LITHOTRIPSY (ESWL);  Surgeon: Rene Paci, MD;  Location: [facility];  Service: Urology;  Laterality: Right;   EXTRACORPOREAL SHOCK WAVE LITHOTRIPSY Left 08/01/2019   Procedure: LEFT EXTRACORPOREAL SHOCK WAVE LITHOTRIPSY (ESWL);  Surgeon: Jerilee Field, MD;  Location: [facility];  Service: Urology;  Laterality: Left;   KNEE SURGERY     LAPAROSCOPIC GASTRIC BANDING     LITHOTRIPSY      Social History  reports that she has never smoked. She has never used smokeless tobacco. She reports that she does not drink alcohol and does not use drugs.  Allergies  Allergen Reactions   Shingrix [Zoster Vac Recomb Adjuvanted] Swelling    Family History  Problem Relation Age of Onset   Stroke Mother    CAD Father    CAD Brother   Reviewed on admission  Prior  to Admission medications   Medication Sig Start Date End Date Taking? Authorizing Provider  Cholecalciferol (VITAMIN D) 50 MCG (2000 UT) CAPS Take 2,000 Units by mouth daily.   Yes [provider]  DULoxetine (CYMBALTA) 30 MG capsule Take 90 mg by mouth daily.   Yes [provider]  famotidine (PEPCID) 20 MG tablet Take 20 mg by mouth at bedtime. 11/29/21  Yes [provider]  losartan (COZAAR) 25 MG tablet Take 25 mg by mouth daily. 11/25/21  Yes [provider]  magnesium oxide (MAG-OX) 400 MG tablet Take 400 mg by mouth daily.    Yes [provider]  methenamine (HIPREX) 1 g tablet Take 1 g by mouth 2 (two) times daily. 11/12/21  Yes [provider]  Multiple Vitamin (MULTIVITAMIN) tablet Take 1 tablet by mouth daily.     Yes [provider]  Oxycodone HCl 10 MG TABS Take 10 mg by mouth 4 (four) times daily as needed (pain). 10/24/21  Yes [provider]  oxyCODONE-acetaminophen (PERCOCET) 10-325 MG tablet Take 1 tablet by mouth 4 (four) times daily as needed. 10/27/21  Yes [provider]  pantoprazole (PROTONIX) 40 MG tablet Take 40 mg by mouth 2 (two) times daily.   Yes [provider]  simvastatin (ZOCOR) 20 MG tablet Take 20 mg by mouth daily.   Yes [provider]  vitamin B-12 (CYANOCOBALAMIN) 1000 MCG tablet Take 1,000 mcg by mouth daily.   Yes [provider]  sodium chloride (OCEAN) 0.65 % SOLN nasal spray Place 1 spray into both nostrils as needed for congestion. Patient not taking: Reported on 11/30/2021 02/02/21   Quintella Reichert, MD  tamsulosin (FLOMAX) 0.4 MG CAPS capsule Take 1 capsule (0.4 mg total) by mouth daily. Patient not taking: Reported on 02/02/2021 07/11/19   Rene Paci, MD    Physical Exam: Vitals:   11/30/21 1500 11/30/21 1600 11/30/21 1700 11/30/21 1820  BP: 117/75 111/77 109/80 120/85  Pulse: 64 74 67 76  Resp: 15 13 12 15   Temp: 98.1 F (36.7  C)   98.2 F (36.8 C)  TempSrc: Oral   Oral  SpO2: 99% 100% 100% 99%  Weight:      Height:        Physical Exam Constitutional:      General: She is not in acute distress.    Appearance: Normal appearance.  HENT:     Head: Normocephalic and atraumatic.     Mouth/Throat:     Mouth: Mucous membranes are moist.     Pharynx: Oropharynx is clear.  Eyes:     Extraocular Movements: Extraocular movements intact.     Pupils: Pupils are equal, round, and reactive to light.  Cardiovascular:     Rate and Rhythm: Normal rate and regular rhythm.     Pulses: Normal pulses.     Heart sounds: Normal heart sounds.  Pulmonary:  Effort: Pulmonary effort is normal. No respiratory distress.     Breath sounds: Normal breath sounds.  Abdominal:     General: Bowel sounds are normal. There is no distension.     Palpations: Abdomen is soft.     Tenderness: There is abdominal tenderness.  Musculoskeletal:        General: No swelling or deformity.  Skin:    General: Skin is warm and dry.  Neurological:     General: No focal deficit present.     Mental Status: Mental status is at baseline.    Labs on Admission: I have personally reviewed following labs and imaging studies  CBC: Recent Labs  Lab 11/29/21 1903 11/30/21 0017 11/30/21 1047  WBC 8.6 8.8 7.3  HGB 13.4 11.4* 11.5*  HCT 40.9 34.6* 35.0*  MCV 84.3 84.2 83.9  PLT 185 144* 126*    Basic Metabolic Panel: Recent Labs  Lab 11/29/21 1903  NA 132*  K 3.4*  CL 100  CO2 20*  GLUCOSE 151*  BUN 14  CREATININE 0.84  CALCIUM 8.8*    GFR: Estimated Creatinine Clearance: 70.2 mL/min (by C-G formula based on SCr of 0.84 mg/dL).  Liver Function Tests: Recent Labs  Lab 11/29/21 1903  AST 41  ALT 21  ALKPHOS 61  BILITOT 0.6  PROT 8.1  ALBUMIN 3.7    Urine analysis:    Component Value Date/Time   COLORURINE AMBER (A) 06/20/2015 1947   APPEARANCEUR TURBID (A) 06/20/2015 1947   LABSPEC 1.011 06/20/2015 1947   PHURINE  6.5 06/20/2015 1947   GLUCOSEU NEGATIVE 06/20/2015 1947   HGBUR SMALL (A) 06/20/2015 1947   BILIRUBINUR NEGATIVE 06/20/2015 1947   KETONESUR NEGATIVE 06/20/2015 1947   PROTEINUR 100 (A) 06/20/2015 1947   UROBILINOGEN 1.0 01/28/2011 2058   NITRITE POSITIVE (A) 06/20/2015 1947   LEUKOCYTESUR LARGE (A) 06/20/2015 1947    Radiological Exams on Admission: CT Angio Abd/Pel W and/or Wo Contrast  Result Date: 11/29/2021 CLINICAL DATA:  GI bleed, lower. Rectal bleeding today with syncope. EXAM: CTA ABDOMEN AND PELVIS WITHOUT AND WITH CONTRAST TECHNIQUE: Multidetector CT imaging of the abdomen and pelvis was performed using the standard protocol during bolus administration of intravenous contrast. Multiplanar reconstructed images and MIPs were obtained and reviewed to evaluate the vascular anatomy. RADIATION DOSE REDUCTION: This exam was performed according to the departmental dose-optimization program which includes automated exposure control, adjustment of the mA and/or kV according to patient size and/or use of iterative reconstruction technique. CONTRAST:  OMNIPAQUE IOHEXOL 350 MG/ML SOLN COMPARISON:  07/29/2016. FINDINGS: VASCULAR Aorta: Normal caliber aorta without aneurysm, dissection, vasculitis or significant stenosis. Celiac: Patent without evidence of aneurysm, dissection, vasculitis or significant stenosis. SMA: Patent without evidence of aneurysm, dissection, vasculitis or significant stenosis. Renals: Both renal arteries are patent without evidence of aneurysm, dissection, vasculitis, fibromuscular dysplasia or significant stenosis. IMA: Patent without evidence of aneurysm, dissection, vasculitis or significant stenosis. Hyperemia is noted involving and distal branch of the IMA with prominent IMV in the region of the mid to distal descending colon. Inflow: Patent without evidence of aneurysm, dissection, vasculitis or significant stenosis. Proximal Outflow: Bilateral common femoral and  visualized portions of the superficial and profunda femoral arteries are patent without evidence of aneurysm, dissection, vasculitis or significant stenosis. Veins: The anterior mesenteric vein is prominent in the region of the mid to distal descending colon. Review of the MIP images confirms the above findings. NON-VASCULAR Lower chest: Heart is normal in size. A calcified granuloma is  noted in the right lower lobe. Minimal atelectasis or scarring at the lung bases. Hepatobiliary: No focal abnormality. Hepatic steatosis is noted. There is subtle nodularity of the inferior aspect of the liver, possible underlying cirrhosis. No biliary ductal dilatation. Gallbladder is without stones. Pancreas: Unremarkable. No pancreatic ductal dilatation or surrounding inflammatory changes. Spleen: Normal size. A small hypervascular focus is present in the spleen measuring 8 mm, likely hemangioma. Adrenals/Urinary Tract: The adrenal glands are within normal limits. A cyst is present in the mid left kidney. Renal cortical scarring is noted bilaterally. Bilateral renal calculi are noted without evidence of obstructive uropathy. The bladder the bladder is unremarkable. Stomach/Bowel: A gastric lap band is noted, with moderate herniation of the gastric pouch and lap band. No bowel obstruction, free air, or pneumatosis. No focal hemorrhage is identified. There is bowel wall thickening with surrounding inflammatory changes involving the descending colon. There is a long segment of hyperdense attenuation involving the segment of colon, concerning for contrast extravasation. Hyperemia is noted at the mid to distal descending colon involving a branch of the IMA. Normal appendix is seen in the right lower quadrant. Lymphatic: No abdominal or pelvic lymphadenopathy. Reproductive: Status post hysterectomy. No adnexal masses. Other: No abdominopelvic ascites. Musculoskeletal: Degenerative changes in the thoracolumbar spine. No acute osseous  abnormality. IMPRESSION: VASCULAR 1. No significant arterial stenosis, aneurysm, or occlusion. 2. Hyperemia involving distal segment of the IMA and prominent IMV in the region the mid to distal descending colon. NON-VASCULAR 1. Colonic wall thickening with surrounding fat stranding involving the mid to distal descending colon, compatible with colitis. Hyperemia is noted involving a branch of the IMA and prominent IMV in this region. Long segment of hyperdense attenuation is noted in the mid to distal descending colon on arterial and venous phases, concerning for contrast extravasation and possible active hemorrhage. Conventional angiography may be beneficial for further evaluation. 2. 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 3. Hepatic steatosis. Mild nodularity of the inferior aspect of the liver may be associated with underlying cirrhosis. 4. Bilateral nephrolithiasis. 5. Remaining incidental findings as described above. Electronically Signed   By: Thornell Sartorius M.D.   On: 11/29/2021 23:00    EKG: Independently reviewed.  Sinus tachycardia 112 bpm.  Questionable LVH.  Assessment/Plan Principal Problem:   Acute lower GI bleeding Active Problems:   10 cm Lapband placed in [place]   Hypertension   Hypercholesteremia   Acid reflux   OSA (obstructive sleep apnea)   Obesity (BMI 30-39.9)   Colitis   GI bleeding Colitis > Patient presenting with GI bleeding in the setting of COVID-19 infection and found to have evidence of colitis on CT. > CT also showed evidence of possible contrast extravasation that could represent active GI bleed however consultation with vascular surgery, and IR felt this was less likely and GI felt it was more likely she was having colitis and possible ischemic colitis. > Of note, she did have recent colonoscopy in the last few weeks with polyp removal. Unclear how covid could be contributing. > Hemoglobin initially stable/normal  at 13.4 after receiving IV fluids and did downtrend to 11.4 and then 11.5 on third check this does appear to have a delusional component as her platelets dropped as well.  Lactic acid normal in the ED. - Appreciate GI recommendations, continue with IV fluids - Trial liquid diet, advance as tolerated - Trend CBC - Recheck lactic acid  Covid 19 infection > known  infection for ~4 days PTA, primarily URI symptoms and fatigue/aches. - Continue to monitor / Supportive care  GERD - Continue home PPI and Pepcid  Depression - Continue home duloxetine  Hypertension - Continue home losartan  Hyperlipidemia - Continue home simvastatin  History of UTI - Continue methenamine for UTI suppression  Obesity Status post lap band - Noted  OSA - Continue home CPAP  DVT prophylaxis: SCDs Code Status:   Full Family Communication:  Updated at bedside Disposition Plan:   Patient is from:  Home  Anticipated DC to:  Home  Anticipated DC date:  1 to 2 days  Anticipated DC barriers: None  Consults called:  Vascular surgery, IR consulted in the ED does not appear that they are following at this time.  GI consulted in the ED and they reportedly will see the patient. Admission status:  Observation, telemetry  Severity of Illness: The appropriate patient status for this patient is OBSERVATION. Observation status is judged to be reasonable and necessary in order to provide the required intensity of service to ensure the patient's safety. The patient's presenting symptoms, physical exam findings, and initial radiographic and laboratory data in the context of their medical condition is felt to place them at decreased risk for further clinical deterioration. Furthermore, it is anticipated that the patient will be medically stable for discharge from the hospital within 2 midnights of admission.    Synetta Fail MD [facility]  How to contact the [facility] Attending or Consulting provider 7A - 7P or  covering provider during after hours 7P -7A, for this patient?   Check the care team in [facility] and look for a) attending/consulting [facility] provider listed and b) the [facility] team listed Log into [email or web address] and use [place]'s universal password to access. If you do not have the password, please contact the hospital operator. Locate the [facility] provider you are looking for under [facility] and page to a number that you can be directly reached. If you still have difficulty reaching the provider, please page the [facility] (Director on Call) for the Hospitalists listed on amion for assistance.  11/30/2021, 8:16 PM

## 2021-11-30 NOTE — ED Provider Notes (Signed)
  Physical Exam  BP 91/68   Pulse 68   Temp 99.2 F (37.3 C)   Resp 10   Ht 5\' 5"  (1.651 m)   Wt 74.4 kg   SpO2 98%   BMI 27.29 kg/m   Physical Exam  Procedures  Procedures  ED Course / MDM    Medical Decision Making Amount and/or Complexity of Data Reviewed Labs: ordered. Radiology: ordered.  Risk Prescription drug management. Decision regarding hospitalization.   Has had more rectal bleeding.  Hemoglobin rechecked and has been stable since midnight last night but still down from the 25th.       Davonna Belling, MD 11/30/21 [phone]

## 2021-12-01 DIAGNOSIS — Z87442 Personal history of urinary calculi: Secondary | ICD-10-CM | POA: Diagnosis not present

## 2021-12-01 DIAGNOSIS — K625 Hemorrhage of anus and rectum: Secondary | ICD-10-CM | POA: Diagnosis not present

## 2021-12-01 DIAGNOSIS — R69 Illness, unspecified: Secondary | ICD-10-CM | POA: Diagnosis not present

## 2021-12-01 DIAGNOSIS — R42 Dizziness and giddiness: Secondary | ICD-10-CM | POA: Diagnosis not present

## 2021-12-01 DIAGNOSIS — Z6830 Body mass index (BMI) 30.0-30.9, adult: Secondary | ICD-10-CM | POA: Diagnosis not present

## 2021-12-01 DIAGNOSIS — F32A Depression, unspecified: Secondary | ICD-10-CM | POA: Diagnosis present

## 2021-12-01 DIAGNOSIS — M549 Dorsalgia, unspecified: Secondary | ICD-10-CM | POA: Diagnosis not present

## 2021-12-01 DIAGNOSIS — U071 COVID-19: Secondary | ICD-10-CM | POA: Diagnosis present

## 2021-12-01 DIAGNOSIS — T80818A Extravasation of other vesicant agent, initial encounter: Secondary | ICD-10-CM | POA: Diagnosis not present

## 2021-12-01 DIAGNOSIS — Z79899 Other long term (current) drug therapy: Secondary | ICD-10-CM | POA: Diagnosis not present

## 2021-12-01 DIAGNOSIS — K922 Gastrointestinal hemorrhage, unspecified: Secondary | ICD-10-CM | POA: Diagnosis not present

## 2021-12-01 DIAGNOSIS — Z90710 Acquired absence of both cervix and uterus: Secondary | ICD-10-CM | POA: Diagnosis not present

## 2021-12-01 DIAGNOSIS — F419 Anxiety disorder, unspecified: Secondary | ICD-10-CM | POA: Diagnosis not present

## 2021-12-01 DIAGNOSIS — Z86010 Personal history of colonic polyps: Secondary | ICD-10-CM | POA: Diagnosis not present

## 2021-12-01 DIAGNOSIS — K921 Melena: Secondary | ICD-10-CM | POA: Diagnosis not present

## 2021-12-01 DIAGNOSIS — E669 Obesity, unspecified: Secondary | ICD-10-CM | POA: Diagnosis not present

## 2021-12-01 DIAGNOSIS — R1084 Generalized abdominal pain: Secondary | ICD-10-CM | POA: Diagnosis not present

## 2021-12-01 DIAGNOSIS — K219 Gastro-esophageal reflux disease without esophagitis: Secondary | ICD-10-CM | POA: Diagnosis present

## 2021-12-01 DIAGNOSIS — I10 Essential (primary) hypertension: Secondary | ICD-10-CM | POA: Diagnosis not present

## 2021-12-01 DIAGNOSIS — G4733 Obstructive sleep apnea (adult) (pediatric): Secondary | ICD-10-CM | POA: Diagnosis present

## 2021-12-01 DIAGNOSIS — D649 Anemia, unspecified: Secondary | ICD-10-CM | POA: Diagnosis not present

## 2021-12-01 DIAGNOSIS — Z87440 Personal history of urinary (tract) infections: Secondary | ICD-10-CM | POA: Diagnosis not present

## 2021-12-01 DIAGNOSIS — N390 Urinary tract infection, site not specified: Secondary | ICD-10-CM | POA: Diagnosis not present

## 2021-12-01 DIAGNOSIS — Z887 Allergy status to serum and vaccine status: Secondary | ICD-10-CM | POA: Diagnosis not present

## 2021-12-01 DIAGNOSIS — G8929 Other chronic pain: Secondary | ICD-10-CM | POA: Diagnosis not present

## 2021-12-01 DIAGNOSIS — K559 Vascular disorder of intestine, unspecified: Secondary | ICD-10-CM | POA: Diagnosis not present

## 2021-12-01 DIAGNOSIS — E7800 Pure hypercholesterolemia, unspecified: Secondary | ICD-10-CM | POA: Diagnosis present

## 2021-12-01 LAB — CBC
HCT: 34.3 % — ABNORMAL LOW (ref 36.0–46.0)
Hemoglobin: 10.8 g/dL — ABNORMAL LOW (ref 12.0–15.0)
MCH: 27.6 pg (ref 26.0–34.0)
MCHC: 31.5 g/dL (ref 30.0–36.0)
MCV: 87.7 fL (ref 80.0–100.0)
Platelets: 112 10*3/uL — ABNORMAL LOW (ref 150–400)
RBC: 3.91 MIL/uL (ref 3.87–5.11)
RDW: 14.2 % (ref 11.5–15.5)
WBC: 6 10*3/uL (ref 4.0–10.5)
nRBC: 0 % (ref 0.0–0.2)

## 2021-12-01 LAB — HIV ANTIBODY (ROUTINE TESTING W REFLEX): HIV Screen 4th Generation wRfx: NONREACTIVE

## 2021-12-01 NOTE — Progress Notes (Signed)
Progress Note   Patient: Tracey Powell ION:[id number] DOB: Oct 27, 1960 DOA: 11/29/2021     0 DOS: the patient was seen and examined on 12/01/2021   Brief hospital course: Taken from H&P.  Tracey Powell is a 61 y.o. female with medical history significant of obesity, status post lap band, hypertension, hyperlipidemia, GERD, depression, OSA presenting with rectal bleeding.   Patient with known COVID-19 infection for the past several days.  Initially had just URI symptoms with some associated lightheadedness.  The day prior to her presentation she had some abdominal cramping and went to go to the bathroom but she became lightheaded on standing and fell to the ground, does not recall fully passing out.  She then went to the bathroom and had a bowel movement that she did not look at.  She had intermittent abdominal cramping following this and then the day of presentation she went to the bathroom and had a bowel movement that seem to be largely blood.   She reports having a colonoscopy a few weeks ago with some polyps removed.   She denies fevers, chills, chest pain, shortness of breath, constipation, nausea, vomiting.  ED course.  Hemodynamically stable.  Hemoglobin on repeat was 11.4 with baseline around 13.4.  FOBT positive.  COVID screen positive. CT of abdomen and pelvis consistent with colitis, also noted to have possible contrast extravasation that could represent active bleed.  Vascular surgery and IR fluids as well as placed on a rate of fluids in the ED.  Vascular surgery and IR were consulted concerning this possible contrast extravasation/bleeding they doubt there is true extravasation and there may have been an over read suspect her bleeding is likely due to the colitis that was noted.  GI was consulted and suspects she might have a degree of ischemic colitis and recommended against antibiotics in favor of IV fluids.  9/27: Hemoglobin dropped to 10.8.  Continued to have significant LLQ  pain. No more active bleeding.  Most likely ischemic colitis per GI.  They were suggesting supportive care and advancing diet as tolerated.  GI signed off-no need for any procedure. Diet advanced to full liquid Should be able to go home tomorrow if tolerating advancement in diet.    Assessment and Plan: * Acute lower GI bleeding Most likely secondary to ischemic colitis. #Recent colonoscopy in June with removal of 3 adenomas and also had some internal hemorrhoids.  GI was consulted and they were recommending conservative management only and then signed off. Hemoglobin at 10.8.  No more active bleeding. -Diet changed to full liquid-advance as tolerated. -Current continue with pain management -Continue with supportive care  Colitis Concern of ischemic colitis. -See above  COVID-19 virus infection Tested +6 days ago.  Mostly upper respiratory symptoms along with fatigue and aches which are now improved.  Having some post-COVID headache. -Continue with supportive care  Kidney infection History of recurrent UTIs -Continue home methenamine  Hypertension - Continue home losartan  Hypercholesteremia - Continue home simvastatin  Acid reflux - Continue PPI and Pepcid  OSA (obstructive sleep apnea) - CPAP at night  Obesity (BMI 30-39.9) S/p lap band. BMI improved to 27 now   Subjective: Patient was complaining about some headache and left lower quadrant pain.  No more active bleeding.  Physical Exam: Vitals:   11/30/21 2030 12/01/21 0057 12/01/21 0430 12/01/21 1300  BP: 125/85 122/84 101/60 127/84  Pulse: 65 61 (!) 57   Resp: 16 18 20    Temp: 98.1 F (36.7 C) 98.1 F (  36.7 C) 98.1 F (36.7 C) 98.8 F (37.1 C)  TempSrc: Oral   Oral  SpO2: 99% 98% 98%   Weight:      Height:       General.  Developed lady, in no acute distress. Pulmonary.  Lungs clear bilaterally, normal respiratory effort. CV.  Regular rate and rhythm, no JVD, rub or murmur. Abdomen.  Soft, mild  LLQ tenderness, nondistended, BS positive. CNS.  Alert and oriented .  No focal neurologic deficit. Extremities.  No edema, no cyanosis, pulses intact and symmetrical. Psychiatry.  Judgment and insight appears normal.  Data Reviewed: Prior data reviewed  Family Communication: Discussed with patient  Disposition: Status is: Inpatient Remains inpatient appropriate because: Severity of illness   Planned Discharge Destination: Home  Time spent: 45 minutes  This record has been created using Systems analyst. Errors have been sought and corrected,but may not always be located. Such creation errors do not reflect on the standard of care.  Author: Lorella Nimrod, MD 12/01/2021 2:36 PM  For on call review [email or web address].

## 2021-12-01 NOTE — Assessment & Plan Note (Signed)
Continue home losartan. ?

## 2021-12-01 NOTE — Assessment & Plan Note (Signed)
History of recurrent UTIs -Continue home methenamine

## 2021-12-01 NOTE — Hospital Course (Addendum)
Taken from H&P.  Tracey Powell is a 61 y.o. female with medical history significant of obesity, status post lap band, hypertension, hyperlipidemia, GERD, depression, OSA presenting with rectal bleeding.   Patient with known COVID-19 infection for the past several days.  Initially had just URI symptoms with some associated lightheadedness.  The day prior to her presentation she had some abdominal cramping and went to go to the bathroom but she became lightheaded on standing and fell to the ground, does not recall fully passing out.  She then went to the bathroom and had a bowel movement that she did not look at.  She had intermittent abdominal cramping following this and then the day of presentation she went to the bathroom and had a bowel movement that seem to be largely blood.   She reports having a colonoscopy a few weeks ago with some polyps removed.   She denies fevers, chills, chest pain, shortness of breath, constipation, nausea, vomiting.  ED course.  Hemodynamically stable.  Hemoglobin on repeat was 11.4 with baseline around 13.4.  FOBT positive.  COVID screen positive. CT of abdomen and pelvis consistent with colitis, also noted to have possible contrast extravasation that could represent active bleed.  Vascular surgery and IR fluids as well as placed on a rate of fluids in the ED.  Vascular surgery and IR were consulted concerning this possible contrast extravasation/bleeding they doubt there is true extravasation and there may have been an over read suspect her bleeding is likely due to the colitis that was noted.  GI was consulted and suspects she might have a degree of ischemic colitis and recommended against antibiotics in favor of IV fluids.  9/27: Hemoglobin dropped to 10.8.  Continued to have significant LLQ pain. No more active bleeding.  Most likely ischemic colitis per GI.  They were suggesting supportive care and advancing diet as tolerated.  GI signed off-no need for any  procedure. Diet advanced to full liquid Should be able to go home tomorrow if tolerating advancement in diet.

## 2021-12-01 NOTE — Assessment & Plan Note (Signed)
-   Continue PPI and Pepcid 

## 2021-12-01 NOTE — Assessment & Plan Note (Signed)
Concern of ischemic colitis. -See above

## 2021-12-01 NOTE — Progress Notes (Signed)
Pt refused CPAP qhs.  Pt states that she does not wear it all the time at home and does not want to wear it while in the hospital.  Pt encouraged to contact RT should she change her mind.

## 2021-12-01 NOTE — Assessment & Plan Note (Signed)
-   Continue home simvastatin 

## 2021-12-01 NOTE — Plan of Care (Signed)
Patient is progressing well 

## 2021-12-01 NOTE — Assessment & Plan Note (Signed)
-   CPAP at night °

## 2021-12-01 NOTE — Progress Notes (Signed)
Patient is alert and oriented with c/o a headache. She denies body ache and has been afebrile. Patient also c/o LUQ pain but denies urinary discomfort. MD made aware.

## 2021-12-01 NOTE — Assessment & Plan Note (Signed)
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 #Recent colonoscopy in June with removal of 3 adenomas and also had some internal hemorrhoids.  GI was consulted and they were recommending conservative management only and then signed off. Hemoglobin at 10.8.  No more active bleeding. -Diet changed to full liquid-advance as tolerated. -Current continue with pain management -Continue with supportive care

## 2021-12-01 NOTE — Progress Notes (Signed)
Patient refuses CPAP while at the hospital 

## 2021-12-01 NOTE — Assessment & Plan Note (Signed)
S/p lap band. BMI improved to 27 now

## 2021-12-01 NOTE — Assessment & Plan Note (Signed)
Tested +6 days ago.  Mostly upper respiratory symptoms along with fatigue and aches which are now improved.  Having some post-COVID headache. -Continue with supportive care

## 2021-12-01 NOTE — Consult Note (Addendum)
Referring Provider: [facility] Primary Care Physician:  Carol Ada, MD Primary Gastroenterologist: Dr. Michail Sermon  Reason for Consultation: Abdominal pain, rectal bleeding  HPI: Tracey Powell is a 61 y.o. female with past medical history of reflux, DJD D, hypercholesterolemia, hypertension, OSA.  Patient presented to the emergency room 11/29/2021 for evaluation of severe lower abdominal cramping and rectal bleeding.   She reports starting 9/22 she began to feel ill she tested positive for COVID-19, she had a headache, loss of appetite.  Beginning 9/24 she began to have severe lower abdominal stomach cramps.  She attempted to go to the bathroom but had an episode of syncope while in the bathroom prior to using the bathroom.  After awaking she was able to have a bowel movement notes it was dark in the room so she did not evaluate her bowel movement.  The morning of 9/25 she continued to have severe lower abdominal cramping, at this time she noted volume bright red blood every time she went to the bathroom.  With urinating she would also have some rectal bleeding.  She denies any stool during her bowel movements she only had bleeding.  She went to the bathroom several times that day.  She attempted to call her PCP office but was able to contact.  Given severe pain and rectal bleeding she presented to the emergency room.  In the emergency room she had no leukocytosis, Hgb 13.4, subsequent Hgb 11.4, BUN 14, CR 0.84, NA 132, K 3.4, lactic acid 1.5.  Had 1 further episode of rectal bleeding.  CT angio GI bleed revealing for colitis, there is possible active extravasation, but after review Dr. Watt Climes an IR team suspect ischemic colitis.  Patient was given IV fluid boluses and maintenance IV fluids.  She was transferred to [facility].  This morning patient notes she is feeling much better. Each time she has a BM she would notice less blood. She has noticed no rectal bleeding since yesterday.  Her abdominal  pain is improved though she is still slightly tender in the lower abdomen.  She has tolerated clear liquid diet well she is eating Jell-O's and drinking water with no worsening of pain.  She is hungry.  Colonoscopy 08/05/2021 Dr. Michail Sermon 3 TA removed, internal hemorrhoids, recall 5 years  EGD 08/05/2021 Esophageal biopsies negative for negative for EOE and Barrett's esophagus, gastric biopsies negative for H. pylori.  Past Medical History:  Diagnosis Date   Acid reflux    Anxiety    Chronic back pain    Complication of anesthesia    slow to wake up   Depressed    DJD (degenerative joint disease)    Hypercholesteremia    Hypertension    Kidney infection    Nephrolithiasis    OSA (obstructive sleep apnea)    mild with AHI 13/hr now on CPAP at 11cm H2O   Urolithiasis     Past Surgical History:  Procedure Laterality Date   ABDOMINAL HYSTERECTOMY     ELBOW SURGERY     EXTRACORPOREAL SHOCK WAVE LITHOTRIPSY Right 07/11/2019   Procedure: EXTRACORPOREAL SHOCK WAVE LITHOTRIPSY (ESWL);  Surgeon: Ceasar Mons, MD;  [facility];  Service: Urology;  Laterality: Right;   EXTRACORPOREAL SHOCK WAVE LITHOTRIPSY Left 08/01/2019   Procedure: LEFT EXTRACORPOREAL SHOCK WAVE LITHOTRIPSY (ESWL);  Surgeon: Festus Aloe, MD;  Location: [facility];  Service: Urology;  Laterality: Left;   KNEE SURGERY     LAPAROSCOPIC GASTRIC BANDING  LITHOTRIPSY      Prior to Admission medications   Medication Sig Start Date End Date Taking? Authorizing Provider  Cholecalciferol (VITAMIN D) 50 MCG (2000 UT) CAPS Take 2,000 Units by mouth daily.   Yes [provider]  DULoxetine (CYMBALTA) 30 MG capsule Take 90 mg by mouth daily.   Yes [provider]  famotidine (PEPCID) 20 MG tablet Take 20 mg by mouth at bedtime. 11/29/21  Yes [provider]  losartan (COZAAR) 25 MG tablet Take 25 mg by mouth daily. 11/25/21  Yes [provider]  magnesium oxide (MAG-OX) 400 MG tablet Take 400 mg by mouth daily.    Yes [provider]  methenamine (HIPREX) 1 g tablet Take 1 g by mouth 2 (two) times daily. 11/12/21  Yes [provider]  Multiple Vitamin (MULTIVITAMIN) tablet Take 1 tablet by mouth daily.     Yes [provider]  Oxycodone HCl 10 MG TABS Take 10 mg by mouth 4 (four) times daily as needed (pain). 10/24/21  Yes [provider]  oxyCODONE-acetaminophen (PERCOCET) 10-325 MG tablet Take 1 tablet by mouth 4 (four) times daily as needed. 10/27/21  Yes [provider]  pantoprazole (PROTONIX) 40 MG tablet Take 40 mg by mouth 2 (two) times daily.   Yes [provider]  simvastatin (ZOCOR) 20 MG tablet Take 20 mg by mouth daily.   Yes [provider]  vitamin B-12 (CYANOCOBALAMIN) 1000 MCG tablet Take 1,000 mcg by mouth daily.   Yes [provider]  sodium chloride (OCEAN) 0.65 % SOLN nasal spray Place 1 spray into both nostrils as needed for congestion. Patient not taking: Reported on 11/30/2021 02/02/21   Quintella Reichert, MD  tamsulosin (FLOMAX) 0.4 MG CAPS capsule Take 1 capsule (0.4 mg total) by mouth daily. Patient not taking: Reported on 02/02/2021 07/11/19   Rene Paci, MD    Scheduled Meds:  DULoxetine  90 mg Oral Daily   famotidine  20 mg Oral QHS   losartan  25 mg Oral Daily   methenamine  1,000 mg Oral QID   pantoprazole  40 mg Oral BID   simvastatin  20 mg Oral Daily   sodium chloride flush  3 mL Intravenous Q12H   Continuous Infusions:  lactated ringers 125 mL/hr at 12/01/21 0308   PRN Meds:.acetaminophen **OR** acetaminophen  Allergies as of 11/29/2021 - Review Complete 11/29/2021  Allergen Reaction Noted   Shingrix [zoster vac recomb adjuvanted] Swelling 02/02/2021    Family History  Problem Relation Age of Onset   Stroke Mother    CAD Father    CAD Brother     Social History   Socioeconomic History    Marital status: Married    Spouse name: Not on file   Number of children: Not on file   Years of education: Not on file   Highest education level: Not on file  Occupational History   Not on file  Tobacco Use   Smoking status: Never   Smokeless tobacco: Never  Substance and Sexual Activity   Alcohol use: No   Drug use: No   Sexual activity: Yes  Other Topics Concern   Not on file  Social History Narrative   Not on file   Social Determinants of Health   Financial Resource Strain: Not on file  Food Insecurity: Not on file  Transportation Needs: Not on file  Physical Activity: Not on file  Stress: Not on file  Social Connections: Not on  file  Intimate Partner Violence: Not on file    Review of Systems: All negative except as stated above in HPI.  Physical Exam:Physical Exam Constitutional:      General: She is not in acute distress.    Appearance: Normal appearance. She is normal weight.  HENT:     Head: Normocephalic and atraumatic.     Right Ear: External ear normal.     Left Ear: External ear normal.     Nose: Nose normal.     Mouth/Throat:     Mouth: Mucous membranes are moist.  Eyes:     Pupils: Pupils are equal, round, and reactive to light.  Cardiovascular:     Rate and Rhythm: Normal rate and regular rhythm.     Pulses: Normal pulses.     Heart sounds: Normal heart sounds.  Pulmonary:     Effort: Pulmonary effort is normal.     Breath sounds: Normal breath sounds.  Abdominal:     General: Abdomen is flat. Bowel sounds are normal. There is no distension.     Palpations: Abdomen is soft. There is no mass.     Tenderness: There is abdominal tenderness (mild b/l lower quadrant). There is no guarding or rebound.     Hernia: No hernia is present.  Musculoskeletal:        General: No swelling. Normal range of motion.     Cervical back: Normal range of motion and neck supple.  Skin:    General: Skin is warm and dry.     Coloration: Skin is not pale.   Neurological:     General: No focal deficit present.     Mental Status: She is alert and oriented to person, place, and time. Mental status is at baseline.  Psychiatric:        Mood and Affect: Mood normal.        Behavior: Behavior normal.     Vital signs: Vitals:   12/01/21 0057 12/01/21 0430  BP: 122/84 101/60  Pulse: 61 (!) 57  Resp: 18 20  Temp: 98.1 F (36.7 C) 98.1 F (36.7 C)  SpO2: 98% 98%   Last BM Date : 11/30/21 (per pt)    GI:  Lab Results: Recent Labs    11/30/21 0017 11/30/21 1047 12/01/21 0544  WBC 8.8 7.3 6.0  HGB 11.4* 11.5* 10.8*  HCT 34.6* 35.0* 34.3*  PLT 144* 126* 112*   BMET Recent Labs    11/29/21 1903 11/30/21 1956  NA 132* 138  K 3.4* 3.3*  CL 100 105  CO2 20* 28  GLUCOSE 151* 96  BUN 14 8  CREATININE 0.84 0.62  CALCIUM 8.8* 8.3*   LFT Recent Labs    11/29/21 1903  PROT 8.1  ALBUMIN 3.7  AST 41  ALT 21  ALKPHOS 61  BILITOT 0.6   PT/INR Recent Labs    11/30/21 0017  LABPROT 15.5*  INR 1.2     Studies/Results: CT Angio Abd/Pel W and/or Wo Contrast  Result Date: 11/29/2021 CLINICAL DATA:  GI bleed, lower. Rectal bleeding today with syncope. EXAM: CTA ABDOMEN AND PELVIS WITHOUT AND WITH CONTRAST TECHNIQUE: Multidetector CT imaging of the abdomen and pelvis was performed using the standard protocol during bolus administration of intravenous contrast. Multiplanar reconstructed images and MIPs were obtained and reviewed to evaluate the vascular anatomy. RADIATION DOSE REDUCTION: This exam was performed according to the departmental dose-optimization program which includes automated exposure control, adjustment of the mA and/or kV according to patient  size and/or use of iterative reconstruction technique. CONTRAST:  OMNIPAQUE IOHEXOL 350 MG/ML SOLN COMPARISON:  07/29/2016. FINDINGS: VASCULAR Aorta: Normal caliber aorta without aneurysm, dissection, vasculitis or significant stenosis. Celiac: Patent without evidence of  aneurysm, dissection, vasculitis or significant stenosis. SMA: Patent without evidence of aneurysm, dissection, vasculitis or significant stenosis. Renals: Both renal arteries are patent without evidence of aneurysm, dissection, vasculitis, fibromuscular dysplasia or significant stenosis. IMA: Patent without evidence of aneurysm, dissection, vasculitis or significant stenosis. Hyperemia is noted involving and distal branch of the IMA with prominent IMV in the region of the mid to distal descending colon. Inflow: Patent without evidence of aneurysm, dissection, vasculitis or significant stenosis. Proximal Outflow: Bilateral common femoral and visualized portions of the superficial and profunda femoral arteries are patent without evidence of aneurysm, dissection, vasculitis or significant stenosis. Veins: The anterior mesenteric vein is prominent in the region of the mid to distal descending colon. Review of the MIP images confirms the above findings. NON-VASCULAR Lower chest: Heart is normal in size. A calcified granuloma is noted in the right lower lobe. Minimal atelectasis or scarring at the lung bases. Hepatobiliary: No focal abnormality. Hepatic steatosis is noted. There is subtle nodularity of the inferior aspect of the liver, possible underlying cirrhosis. No biliary ductal dilatation. Gallbladder is without stones. Pancreas: Unremarkable. No pancreatic ductal dilatation or surrounding inflammatory changes. Spleen: Normal size. A small hypervascular focus is present in the spleen measuring 8 mm, likely hemangioma. Adrenals/Urinary Tract: The adrenal glands are within normal limits. A cyst is present in the mid left kidney. Renal cortical scarring is noted bilaterally. Bilateral renal calculi are noted without evidence of obstructive uropathy. The bladder the bladder is unremarkable. Stomach/Bowel: A gastric lap band is noted, with moderate herniation of the gastric pouch and lap band. No bowel obstruction, free  air, or pneumatosis. No focal hemorrhage is identified. There is bowel wall thickening with surrounding inflammatory changes involving the descending colon. There is a long segment of hyperdense attenuation involving the segment of colon, concerning for contrast extravasation. Hyperemia is noted at the mid to distal descending colon involving a branch of the IMA. Normal appendix is seen in the right lower quadrant. Lymphatic: No abdominal or pelvic lymphadenopathy. Reproductive: Status post hysterectomy. No adnexal masses. Other: No abdominopelvic ascites. Musculoskeletal: Degenerative changes in the thoracolumbar spine. No acute osseous abnormality. IMPRESSION: VASCULAR 1. No significant arterial stenosis, aneurysm, or occlusion. 2. Hyperemia involving distal segment of the IMA and prominent IMV in the region the mid to distal descending colon. NON-VASCULAR 1. Colonic wall thickening with surrounding fat stranding involving the mid to distal descending colon, compatible with colitis. Hyperemia is noted involving a branch of the IMA and prominent IMV in this region. Long segment of hyperdense attenuation is noted in the mid to distal descending colon on arterial and venous phases, concerning for contrast extravasation and possible active hemorrhage. Conventional angiography may be beneficial for further evaluation. 2. 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 3. Hepatic steatosis. Mild nodularity of the inferior aspect of the liver may be associated with underlying cirrhosis. 4. Bilateral nephrolithiasis. 5. Remaining incidental findings as described above. Electronically Signed   By: Thornell Sartorius M.D.   On: 11/29/2021 23:00    Impression: Colitis suspect ischemic Lower abdominal cramping Rectal bleeding Anemia COVID-19  HGB 10.8(11.5) Platelets 112(126)  CT angiogram abdomen pelvis with and without contrast 11/29/2021 1. No significant arterial stenosis,  aneurysm, or occlusion. 2. Hyperemia  involving distal segment of the IMA and prominent IMV in the region the mid to distal descending colon. 1. Colonic wall thickening with surrounding fat stranding involving the mid to distal descending colon, compatible with colitis.  2. 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  3. Hepatic steatosis. Mild nodularity of the inferior aspect of the liver may be associated with underlying cirrhosis.   Patient with lower abdominal cramping, rectal bleeding for 3 days, CT significant for colitis, suspect ischemic colitis.  Patient responding well to IV fluids.  Able to tolerate clear liquid diet well.  She has not received antibiotics during this admission.  Abdominal pain is significantly improved.  She has not seen any further bleeding since yesterday.  Recent colonoscopy with Dr. Bosie ClosSchooler in June.    Plan: Continue IV fluids at 125 mL/h Continue pantoprazole 40 mg twice daily Continue Pepcid 20 mg at night Consider advancing diet as tolerated Today to monitor hemoglobin and transfuse if less than 7. [facility] will follow  LOS: 0 days   Emmit AlexandersGabrielle N Jarid Sasso  PA-C 12/01/2021, 7:38 AM  Contact #  [phone]

## 2021-12-02 ENCOUNTER — Inpatient Hospital Stay (HOSPITAL_COMMUNITY): Payer: 59

## 2021-12-02 DIAGNOSIS — K922 Gastrointestinal hemorrhage, unspecified: Secondary | ICD-10-CM | POA: Diagnosis not present

## 2021-12-02 LAB — CBC
HCT: 33.9 % — ABNORMAL LOW (ref 36.0–46.0)
Hemoglobin: 10.8 g/dL — ABNORMAL LOW (ref 12.0–15.0)
MCH: 27.8 pg (ref 26.0–34.0)
MCHC: 31.9 g/dL (ref 30.0–36.0)
MCV: 87.4 fL (ref 80.0–100.0)
Platelets: 113 10*3/uL — ABNORMAL LOW (ref 150–400)
RBC: 3.88 MIL/uL (ref 3.87–5.11)
RDW: 13.9 % (ref 11.5–15.5)
WBC: 5 10*3/uL (ref 4.0–10.5)
nRBC: 0 % (ref 0.0–0.2)

## 2021-12-02 MED ORDER — MECLIZINE HCL 25 MG PO TABS
12.5000 mg | ORAL_TABLET | Freq: Once | ORAL | Status: AC
  Administered 2021-12-02: 12.5 mg via ORAL
  Filled 2021-12-02: qty 1

## 2021-12-02 MED ORDER — MECLIZINE HCL 25 MG PO TABS: 25.0000 mg | ORAL_TABLET | Freq: Three times a day (TID) | ORAL | Status: DC | PRN

## 2021-12-02 MED ORDER — MECLIZINE HCL 25 MG PO TABS: 25.0000 mg | ORAL_TABLET | Freq: Three times a day (TID) | ORAL | 0 refills | Status: AC | PRN

## 2021-12-02 NOTE — Progress Notes (Signed)
Pt complained of intermittent dizziness when changing positions. Orthostatics obtained and negative. MD notified and made aware of situation. Meclizine ordered and administered. Plan of care ongoing.

## 2021-12-02 NOTE — Discharge Summary (Signed)
Physician Discharge Summary   Patient: Tracey Powell MRN: [id number] DOB: [birth year]  Admit date:     11/29/2021  Discharge date: 12/02/21  Discharge Physician: Lorella Nimrod   PCP: Carol Ada, MD   Recommendations at discharge:  Please obtain CBC and BMP in 1 week Follow-up with primary care provider within a week  Discharge Diagnoses: Principal Problem:   Acute lower GI bleeding Active Problems:   Colitis   COVID-19 virus infection   Kidney infection   Hypertension   Hypercholesteremia   Acid reflux   OSA (obstructive sleep apnea)   Obesity (BMI 30-39.9)   Hospital Course: Taken from H&P.  Tracey Powell is a 61 y.o. female with medical history significant of obesity, status post lap band, hypertension, hyperlipidemia, GERD, depression, OSA presenting with rectal bleeding.   Patient with known COVID-19 infection for the past several days.  Initially had just URI symptoms with some associated lightheadedness.  The day prior to her presentation she had some abdominal cramping and went to go to the bathroom but she became lightheaded on standing and fell to the ground, does not recall fully passing out.  She then went to the bathroom and had a bowel movement that she did not look at.  She had intermittent abdominal cramping following this and then the day of presentation she went to the bathroom and had a bowel movement that seem to be largely blood.   She reports having a colonoscopy a few weeks ago with some polyps removed.   She denies fevers, chills, chest pain, shortness of breath, constipation, nausea, vomiting.  ED course.  Hemodynamically stable.  Hemoglobin on repeat was 11.4 with baseline around 13.4.  FOBT positive.  COVID screen positive. CT of abdomen and pelvis consistent with colitis, also noted to have possible contrast extravasation that could represent active bleed.  Vascular surgery and IR fluids as well as placed on a rate of fluids in the ED.  Vascular  surgery and IR were consulted concerning this possible contrast extravasation/bleeding they doubt there is true extravasation and there may have been an over read suspect her bleeding is likely due to the colitis that was noted.  GI was consulted and suspects she might have a degree of ischemic colitis and recommended against antibiotics in favor of IV fluids.  9/27: Hemoglobin dropped to 10.8.  Continued to have significant LLQ pain. No more active bleeding.  Most likely ischemic colitis per GI.  They were suggesting supportive care and advancing diet as tolerated.  GI signed off-no need for any procedure. Diet advanced to full liquid  9/28: Patient was able to tolerate diet and no more bloody bowel movements.  Abdominal pain improved.  She developed persistent dizziness, feel like room spinning along with some blurry vision bilaterally.  No nystagmus.  Finger-nose was coordinated. Orthostatic vitals were negative.  She was given meclizine with no change.  CT head was obtained to rule out any posterior stroke and it was negative for any acute abnormality.  Patient was given some meclizine to be used as needed. She was instructed to keep herself well-hydrated and avoid constipation.  She will continue on current medications and need to have a close follow-up with her providers for further recommendations.  Assessment and Plan: * Acute lower GI bleeding Most likely secondary to ischemic colitis. #Recent colonoscopy in June with removal of 3 adenomas and also had some internal hemorrhoids.  GI was consulted and they were recommending conservative management only and then signed off.  Hemoglobin at 10.8.  No more active bleeding. -Diet changed to full liquid-advance as tolerated. -Current continue with pain management -Continue with supportive care  Colitis Concern of ischemic colitis. -See above  COVID-19 virus infection Tested +6 days ago.  Mostly upper respiratory symptoms along with  fatigue and aches which are now improved.  Having some post-COVID headache. -Continue with supportive care  Kidney infection History of recurrent UTIs -Continue home methenamine  Hypertension - Continue home losartan  Hypercholesteremia - Continue home simvastatin  Acid reflux - Continue PPI and Pepcid  OSA (obstructive sleep apnea) - CPAP at night  Obesity (BMI 30-39.9) S/p lap band. BMI improved to 27 now   Consultants: GI Procedures performed: None Disposition: Home Diet recommendation:  Discharge Diet Orders (From admission, onward)     Start     Ordered   12/02/21 0000  Diet - low sodium heart healthy        12/02/21 1222           Cardiac diet DISCHARGE MEDICATION: Allergies as of 12/02/2021       Reactions   Shingrix [zoster Vac Recomb Adjuvanted] Swelling        Medication List     STOP taking these medications    Oxycodone HCl 10 MG Tabs   tamsulosin 0.4 MG Caps capsule Commonly known as: FLOMAX       TAKE these medications    cyanocobalamin 1000 MCG tablet Commonly known as: VITAMIN B12 Take 1,000 mcg by mouth daily.   DULoxetine 30 MG capsule Commonly known as: CYMBALTA Take 90 mg by mouth daily.   famotidine 20 MG tablet Commonly known as: PEPCID Take 20 mg by mouth at bedtime.   losartan 25 MG tablet Commonly known as: COZAAR Take 25 mg by mouth daily.   magnesium oxide 400 MG tablet Commonly known as: MAG-OX Take 400 mg by mouth daily.   meclizine 25 MG tablet Commonly known as: ANTIVERT Take 1 tablet (25 mg total) by mouth 3 (three) times daily as needed for dizziness.   methenamine 1 g tablet Commonly known as: HIPREX Take 1 g by mouth 2 (two) times daily.   multivitamin tablet Take 1 tablet by mouth daily.   oxyCODONE-acetaminophen 10-325 MG tablet Commonly known as: PERCOCET Take 1 tablet by mouth 4 (four) times daily as needed.   pantoprazole 40 MG tablet Commonly known as: PROTONIX Take 40 mg by  mouth 2 (two) times daily.   simvastatin 20 MG tablet Commonly known as: ZOCOR Take 20 mg by mouth daily.   sodium chloride 0.65 % Soln nasal spray Commonly known as: OCEAN Place 1 spray into both nostrils as needed for congestion.   Vitamin D 50 MCG (2000 UT) Caps Take 2,000 Units by mouth daily.        Follow-up Information     Merri Brunette, MD. Schedule an appointment as soon as possible for a visit in 1 week(s).   Specialty: Family Medicine Contact information: [address], [address] [place] [address] [phone]         Quintella Reichert, MD .   Specialty: Cardiology Contact information: [phone] [address] [place] [address] [phone]                Discharge Exam: Ceasar Mons Weights   11/29/21 1852 12/02/21 0500  Weight: 74.4 kg 76.1 kg   General.     In no acute distress. Pulmonary.  Lungs clear bilaterally, normal respiratory effort.  CV.  Regular rate and rhythm, no JVD, rub or murmur. Abdomen.  Soft, nontender, nondistended, BS positive. CNS.  Alert and oriented .  No focal neurologic deficit. Extremities.  No edema, no cyanosis, pulses intact and symmetrical. Psychiatry.  Judgment and insight appears normal.   Condition at discharge: stable  The results of significant diagnostics from this hospitalization (including imaging, microbiology, ancillary and laboratory) are listed below for reference.   Imaging Studies: CT HEAD WO CONTRAST ( )  Result Date: 12/02/2021 CLINICAL DATA:  Dizziness and blurred vision EXAM: CT HEAD WITHOUT CONTRAST TECHNIQUE: Contiguous axial images were obtained from the base of the skull through the vertex without intravenous contrast. RADIATION DOSE REDUCTION: This exam was performed according to the departmental dose-optimization program which includes automated exposure control, adjustment of the mA and/or kV according to patient size and/or use of iterative reconstruction technique.  COMPARISON:  CT head 01/29/2010 FINDINGS: Brain: No evidence of acute infarction, hemorrhage, hydrocephalus, extra-axial collection or mass lesion/mass effect. Vascular: Negative for hyperdense vessel Skull: Negative Sinuses/Orbits: Mild mucosal edema paranasal sinuses. Negative orbit Other: None IMPRESSION: Negative CT of the brain. Electronically Signed   By: Marlan Palau M.D.   On: 12/02/2021 11:25   CT Angio Abd/Pel W and/or Wo Contrast  Result Date: 11/29/2021 CLINICAL DATA:  GI bleed, lower. Rectal bleeding today with syncope. EXAM: CTA ABDOMEN AND PELVIS WITHOUT AND WITH CONTRAST TECHNIQUE: Multidetector CT imaging of the abdomen and pelvis was performed using the standard protocol during bolus administration of intravenous contrast. Multiplanar reconstructed images and MIPs were obtained and reviewed to evaluate the vascular anatomy. RADIATION DOSE REDUCTION: This exam was performed according to the departmental dose-optimization program which includes automated exposure control, adjustment of the mA and/or kV according to patient size and/or use of iterative reconstruction technique. CONTRAST:  OMNIPAQUE IOHEXOL 350 MG/ML SOLN COMPARISON:  07/29/2016. FINDINGS: VASCULAR Aorta: Normal caliber aorta without aneurysm, dissection, vasculitis or significant stenosis. Celiac: Patent without evidence of aneurysm, dissection, vasculitis or significant stenosis. SMA: Patent without evidence of aneurysm, dissection, vasculitis or significant stenosis. Renals: Both renal arteries are patent without evidence of aneurysm, dissection, vasculitis, fibromuscular dysplasia or significant stenosis. IMA: Patent without evidence of aneurysm, dissection, vasculitis or significant stenosis. Hyperemia is noted involving and distal branch of the IMA with prominent IMV in the region of the mid to distal descending colon. Inflow: Patent without evidence of aneurysm, dissection, vasculitis or significant stenosis.  Proximal Outflow: Bilateral common femoral and visualized portions of the superficial and profunda femoral arteries are patent without evidence of aneurysm, dissection, vasculitis or significant stenosis. Veins: The anterior mesenteric vein is prominent in the region of the mid to distal descending colon. Review of the MIP images confirms the above findings. NON-VASCULAR Lower chest: Heart is normal in size. A calcified granuloma is noted in the right lower lobe. Minimal atelectasis or scarring at the lung bases. Hepatobiliary: No focal abnormality. Hepatic steatosis is noted. There is subtle nodularity of the inferior aspect of the liver, possible underlying cirrhosis. No biliary ductal dilatation. Gallbladder is without stones. Pancreas: Unremarkable. No pancreatic ductal dilatation or surrounding inflammatory changes. Spleen: Normal size. A small hypervascular focus is present in the spleen measuring 8 mm, likely hemangioma. Adrenals/Urinary Tract: The adrenal glands are within normal limits. A cyst is present in the mid left kidney. Renal cortical scarring is noted bilaterally. Bilateral renal calculi are noted without evidence of obstructive uropathy. The bladder the bladder is unremarkable. Stomach/Bowel: A gastric lap band is noted,  with moderate herniation of the gastric pouch and lap band. No bowel obstruction, free air, or pneumatosis. No focal hemorrhage is identified. There is bowel wall thickening with surrounding inflammatory changes involving the descending colon. There is a long segment of hyperdense attenuation involving the segment of colon, concerning for contrast extravasation. Hyperemia is noted at the mid to distal descending colon involving a branch of the IMA. Normal appendix is seen in the right lower quadrant. Lymphatic: No abdominal or pelvic lymphadenopathy. Reproductive: Status post hysterectomy. No adnexal masses. Other: No abdominopelvic ascites. Musculoskeletal: Degenerative changes  in the thoracolumbar spine. No acute osseous abnormality. IMPRESSION: VASCULAR 1. No significant arterial stenosis, aneurysm, or occlusion. 2. Hyperemia involving distal segment of the IMA and prominent IMV in the region the mid to distal descending colon. NON-VASCULAR 1. Colonic wall thickening with surrounding fat stranding involving the mid to distal descending colon, compatible with colitis. Hyperemia is noted involving a branch of the IMA and prominent IMV in this region. Long segment of hyperdense attenuation is noted in the mid to distal descending colon on arterial and venous phases, concerning for contrast extravasation and possible active hemorrhage. Conventional angiography may be beneficial for further evaluation. 2. 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 3. Hepatic steatosis. Mild nodularity of the inferior aspect of the liver may be associated with underlying cirrhosis. 4. Bilateral nephrolithiasis. 5. Remaining incidental findings as described above. Electronically Signed   By: Thornell SartoriusLaura  Taylor M.D.   On: 11/29/2021 23:00    Microbiology: Results for orders placed or performed during the hospital encounter of 11/29/21  SARS Coronavirus 2 by RT PCR (hospital order, [facility]) *cepheid single result test* Anterior Nasal Swab     Status: Abnormal   Collection Time: 11/29/21 11:47 PM   Specimen: Anterior Nasal Swab  Result Value Ref Range Status   SARS Coronavirus 2 by RT PCR POSITIVE (A) NEGATIVE Final    Comment: (NOTE) SARS-CoV-2 target nucleic acids are DETECTED  SARS-CoV-2 RNA is generally detectable in upper respiratory specimens  during the acute phase of infection.  Positive results are indicative  of the presence of the identified virus, but do not rule out bacterial infection or co-infection with other pathogens not detected by the test.  Clinical correlation with patient history and  other diagnostic  information is necessary to determine patient infection status.  The expected result is negative.  Fact Sheet for Patients:   RoadLapTop.co.zahttps://www.fda.gov/media/158405/download   Fact Sheet for Healthcare Providers:   http://kim-miller.com/https://www.fda.gov/media/158404/download    This test is not yet approved or cleared by the [place] FDA and  has been authorized for detection and/or diagnosis of SARS-CoV-2 by FDA under an Emergency Use Authorization (EUA).  This EUA will remain in effect (meaning this test can be used) for the duration of  the COVID-19 declaration under Section 564(b)(1)  of the Act, 21 U.S.C. section 360-bbb-3(b)(1), unless the authorization is terminated or revoked sooner.   Performed at [facility] [place], [address] [place], [place] [id number]     Labs: CBC: Recent Labs  Lab 11/29/21 1903 11/30/21 0017 11/30/21 1047 12/01/21 0544 12/02/21 0542  WBC 8.6 8.8 7.3 6.0 5.0  HGB 13.4 11.4* 11.5* 10.8* 10.8*  HCT 40.9 34.6* 35.0* 34.3* 33.9*  MCV 84.3 84.2 83.9 87.7 87.4  PLT 185 144* 126* 112* 113*   Basic Metabolic Panel: Recent Labs  Lab 11/29/21 1903 11/30/21 1956  NA 132*  138  K 3.4* 3.3*  CL 100 105  CO2 20* 28  GLUCOSE 151* 96  BUN 14 8  CREATININE 0.84 0.62  CALCIUM 8.8* 8.3*   Liver Function Tests: Recent Labs  Lab 11/29/21 1903  AST 41  ALT 21  ALKPHOS 61  BILITOT 0.6  PROT 8.1  ALBUMIN 3.7   CBG: No results for input(s): "GLUCAP" in the last 168 hours.  Discharge time spent: greater than 30 minutes.  This record has been created using Conservation officer, historic buildings. Errors have been sought and corrected,but may not always be located. Such creation errors do not reflect on the standard of care.   Signed: Arnetha Courser, MD [facility] 12/02/2021

## 2021-12-02 NOTE — Progress Notes (Signed)
  Transition of Care [facility]) Screening Note   Patient Details  Name: Tracey Powell Date of Birth: 1960/12/01   Transition of Care [facility]) CM/SW Contact:    Vassie Moselle, LCSW Phone Number: 12/02/2021, 12:30 PM    Transition of Care Department [facility]) has reviewed patient and no TOC needs have been identified at this time. We will continue to monitor patient advancement through interdisciplinary progression rounds. If new patient transition needs arise, please place a TOC consult.

## 2021-12-07 DIAGNOSIS — N3080 Other cystitis without hematuria: Secondary | ICD-10-CM | POA: Diagnosis not present

## 2021-12-13 ENCOUNTER — Inpatient Hospital Stay: Payer: 59 | Attending: Hematology and Oncology | Admitting: Hematology and Oncology

## 2021-12-13 ENCOUNTER — Inpatient Hospital Stay: Payer: 59

## 2021-12-13 ENCOUNTER — Encounter: Payer: Self-pay | Admitting: Hematology and Oncology

## 2021-12-13 ENCOUNTER — Other Ambulatory Visit: Payer: Self-pay | Admitting: *Deleted

## 2021-12-13 VITALS — BP 113/93 | HR 79 | Temp 97.9°F | Resp 16 | Ht 65.0 in | Wt 166.6 lb

## 2021-12-13 DIAGNOSIS — K449 Diaphragmatic hernia without obstruction or gangrene: Secondary | ICD-10-CM | POA: Diagnosis not present

## 2021-12-13 DIAGNOSIS — K219 Gastro-esophageal reflux disease without esophagitis: Secondary | ICD-10-CM | POA: Insufficient documentation

## 2021-12-13 DIAGNOSIS — E785 Hyperlipidemia, unspecified: Secondary | ICD-10-CM | POA: Diagnosis not present

## 2021-12-13 DIAGNOSIS — R778 Other specified abnormalities of plasma proteins: Secondary | ICD-10-CM | POA: Insufficient documentation

## 2021-12-13 DIAGNOSIS — I10 Essential (primary) hypertension: Secondary | ICD-10-CM | POA: Diagnosis not present

## 2021-12-13 DIAGNOSIS — Z8616 Personal history of COVID-19: Secondary | ICD-10-CM | POA: Insufficient documentation

## 2021-12-13 DIAGNOSIS — N200 Calculus of kidney: Secondary | ICD-10-CM | POA: Insufficient documentation

## 2021-12-13 DIAGNOSIS — E7800 Pure hypercholesterolemia, unspecified: Secondary | ICD-10-CM | POA: Diagnosis not present

## 2021-12-13 DIAGNOSIS — G4730 Sleep apnea, unspecified: Secondary | ICD-10-CM | POA: Insufficient documentation

## 2021-12-13 DIAGNOSIS — Z79899 Other long term (current) drug therapy: Secondary | ICD-10-CM | POA: Insufficient documentation

## 2021-12-13 DIAGNOSIS — G4733 Obstructive sleep apnea (adult) (pediatric): Secondary | ICD-10-CM | POA: Diagnosis not present

## 2021-12-13 DIAGNOSIS — G8929 Other chronic pain: Secondary | ICD-10-CM | POA: Insufficient documentation

## 2021-12-13 DIAGNOSIS — M1990 Unspecified osteoarthritis, unspecified site: Secondary | ICD-10-CM | POA: Insufficient documentation

## 2021-12-13 DIAGNOSIS — Z87442 Personal history of urinary calculi: Secondary | ICD-10-CM | POA: Diagnosis not present

## 2021-12-13 DIAGNOSIS — R779 Abnormality of plasma protein, unspecified: Secondary | ICD-10-CM | POA: Diagnosis not present

## 2021-12-13 LAB — COMPREHENSIVE METABOLIC PANEL
ALT: 17 U/L (ref 0–44)
AST: 27 U/L (ref 15–41)
Albumin: 4.1 g/dL (ref 3.5–5.0)
Alkaline Phosphatase: 58 U/L (ref 38–126)
Anion gap: 5 (ref 5–15)
BUN: 11 mg/dL (ref 8–23)
CO2: 32 mmol/L (ref 22–32)
Calcium: 9.1 mg/dL (ref 8.9–10.3)
Chloride: 102 mmol/L (ref 98–111)
Creatinine, Ser: 0.76 mg/dL (ref 0.44–1.00)
GFR, Estimated: >60 mL/min (ref >60)
Glucose, Bld: 107 mg/dL — ABNORMAL HIGH (ref 70–99)
Potassium: 4 mmol/L (ref 3.5–5.1)
Sodium: 139 mmol/L (ref 135–145)
Total Bilirubin: 0.6 mg/dL (ref 0.3–1.2)
Total Protein: 7.8 g/dL (ref 6.5–8.1)

## 2021-12-13 LAB — CBC WITH DIFFERENTIAL/PLATELET
Abs Immature Granulocytes: 0.02 10*3/uL (ref 0.00–0.07)
Basophils Absolute: 0 10*3/uL (ref 0.0–0.1)
Basophils Relative: 1 %
Eosinophils Absolute: 0.1 10*3/uL (ref 0.0–0.5)
Eosinophils Relative: 1 %
HCT: 37.9 % (ref 36.0–46.0)
Hemoglobin: 12.3 g/dL (ref 12.0–15.0)
Immature Granulocytes: 0 %
Lymphocytes Relative: 28 %
Lymphs Abs: 1.7 10*3/uL (ref 0.7–4.0)
MCH: 27.9 pg (ref 26.0–34.0)
MCHC: 32.5 g/dL (ref 30.0–36.0)
MCV: 85.9 fL (ref 80.0–100.0)
Monocytes Absolute: 0.3 10*3/uL (ref 0.1–1.0)
Monocytes Relative: 5 %
Neutro Abs: 4 10*3/uL (ref 1.7–7.7)
Neutrophils Relative %: 65 %
Platelets: 179 10*3/uL (ref 150–400)
RBC: 4.41 MIL/uL (ref 3.87–5.11)
RDW: 14 % (ref 11.5–15.5)
WBC: 6.1 10*3/uL (ref 4.0–10.5)
nRBC: 0 % (ref 0.0–0.2)

## 2021-12-13 NOTE — Assessment & Plan Note (Signed)
This is a very pleasant 61 year old female patient with hypertension, dyslipidemia, obstructive sleep apnea referred to hematology for evaluation of abnormal SPEP.  She denies any new health complaints at all except for recent COVID-19, rectal bleed and syncope.  She was thought to have ischemic colitis causing GI bleeding and was followed up by gastroenterology.  She has since then recovered.  She had some routine labs which showed elevated beta-2 globulin, total protein, mild hypercalcemia and had some further investigation with a serum protein electrophoresis Commodore definitive monoclonal spike but some abnormal labs noted.  Physical examination today without any major concerns.  At this time given the lack of evidence of monoclonal spike, we have less concern for myeloma or related abnormalities.  I have however recommended repeating CBC, CMP, SPEP, UPEP, kappa lambda ratio today.  If these labs are unremarkable, then she can follow-up with [place] as needed.  She is otherwise up-to-date with age-appropriate cancer screening. Thank you for consulting [place] care of this patient.  Please do not hesitate contact us with any additional questions or concerns.

## 2021-12-13 NOTE — Progress Notes (Signed)
[facility] CONSULT NOTE  Patient Care Team: Carol Ada, MD as PCP - General (Family Medicine) Sueanne Margarita, MD as PCP - Cardiology (Cardiology)  CHIEF COMPLAINTS/PURPOSE OF CONSULTATION:  Abnormal SPEP  ASSESSMENT & PLAN:  Abnormal SPEP This is a very pleasant 61 year old female patient with hypertension, dyslipidemia, obstructive sleep apnea referred to hematology for evaluation of abnormal SPEP.  She denies any new health complaints at all except for recent COVID-19, rectal bleed and syncope.  She was thought to have ischemic colitis causing GI bleeding and was followed up by gastroenterology.  She has since then recovered.  She had some routine labs which showed elevated beta-2 globulin, total protein, mild hypercalcemia and had some further investigation with a serum protein electrophoresis Commodore definitive monoclonal spike but some abnormal labs noted.  Physical examination today without any major concerns.  At this time given the lack of evidence of monoclonal spike, we have less concern for myeloma or related abnormalities.  I have however recommended repeating CBC, CMP, SPEP, UPEP, kappa lambda ratio today.  If these labs are unremarkable, then she can follow-up with [place] as needed.  She is otherwise up-to-date with age-appropriate cancer screening. Thank you for consulting [place] care of this patient.  Please do not hesitate contact us with any additional questions or concerns.  Orders Placed This Encounter  Procedures   CBC with Differential/Platelet    Standing Status:   Standing    Number of Occurrences:   22    Standing Expiration Date:   12/14/2022   Comprehensive metabolic panel    Standing Status:   Standing    Number of Occurrences:   33    Standing Expiration Date:   12/14/2022   SPEP with reflex to IFE    Standing Status:   Future    Number of Occurrences:   1    Standing Expiration Date:   12/13/2022   Kappa/lambda light chains    Standing Status:    Future    Number of Occurrences:   1    Standing Expiration Date:   12/13/2022   UPEP/TP, 24-Hr Urine    Standing Status:   Future    Standing Expiration Date:   12/14/2022     HISTORY OF PRESENTING ILLNESS:  Tracey Powell 61 y.o. female is here because of abnormal SPEP.  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  Ms. Dacruz is here for an initial visit by herself.  She tells me that she feels good overall. She denies any fevers, drenching night sweats, loss of appetite or unintentional loss of weight.  She has some bilateral hip pain as well as back pain radiating down the leg but no definitive bone pains otherwise.  She continues to work full-time, had COVID-19 3 weeks ago and was also admitted to the hospital at around the same time with rectal bleeding and syncope.  She was followed by gastroenterology and was thought to have possible ischemic colitis which has since resolved. She has a sister who had breast cancer at a younger age however no other family history of cancer.  Rest of the pertinent 10 point ROS reviewed and negative.  MEDICAL HISTORY:  Past Medical History:  Diagnosis  Date   Acid reflux    Anxiety    Chronic back pain    Complication of anesthesia    slow to wake up   Depressed    DJD (degenerative joint disease)    Hypercholesteremia    Hypertension    Kidney infection    Nephrolithiasis    OSA (obstructive sleep apnea)    mild with AHI 13/hr now on CPAP at 11cm H2O   Urolithiasis     SURGICAL HISTORY: Past Surgical History:  Procedure Laterality Date   ABDOMINAL HYSTERECTOMY     ELBOW SURGERY      EXTRACORPOREAL SHOCK WAVE LITHOTRIPSY Right 07/11/2019   Procedure: EXTRACORPOREAL SHOCK WAVE LITHOTRIPSY (ESWL);  Surgeon: Ceasar Mons, MD;  Location: [facility];  Service: Urology;  Laterality: Right;   EXTRACORPOREAL SHOCK WAVE LITHOTRIPSY Left 08/01/2019   Procedure: LEFT EXTRACORPOREAL SHOCK WAVE LITHOTRIPSY (ESWL);  Surgeon: Festus Aloe, MD;  Location: [facility];  Service: Urology;  Laterality: Left;   KNEE SURGERY     LAPAROSCOPIC GASTRIC BANDING     LITHOTRIPSY      SOCIAL HISTORY: Social History   Socioeconomic History   Marital status: Married    Spouse name: Not on file   Number of children: Not on file   Years of education: Not on file   Highest education level: Not on file  Occupational History   Not on file  Tobacco Use   Smoking status: Never   Smokeless tobacco: Never  Substance and Sexual Activity   Alcohol use: No   Drug use: No   Sexual activity: Yes  Other Topics Concern   Not on file  Social History Narrative   Not on file   Social Determinants of Health   Financial Resource Strain: Not on file  Food Insecurity: No Food Insecurity (12/01/2021)   Hunger Vital Sign    Worried About Running Out of Food in the Last Year: Never true    Ran Out of Food in the Last Year: Never true  Transportation Needs: No Transportation Needs (12/01/2021)   PRAPARE - Hydrologist (Medical): No    Lack of Transportation (Non-Medical): No  Physical Activity: Not on file  Stress: Not on file  Social Connections: Not on file  Intimate Partner Violence: Not At Risk (12/01/2021)   Humiliation, Afraid, Rape, and Kick questionnaire    Fear of Current or Ex-Partner: No    Emotionally Abused: No    Physically Abused: No    Sexually Abused: No    FAMILY HISTORY: Family History  Problem Relation Age of Onset   Stroke Mother    CAD Father    CAD Brother     ALLERGIES:  is allergic to shingrix  [zoster vac recomb adjuvanted].  MEDICATIONS:  Current Outpatient Medications  Medication Sig Dispense Refill   Cholecalciferol (VITAMIN D) 50 MCG (2000 UT) CAPS Take 2,000 Units by mouth daily.     DULoxetine (CYMBALTA) 30 MG capsule Take 90 mg by mouth daily.     famotidine (PEPCID) 20 MG tablet Take 20 mg by mouth at bedtime.     losartan (COZAAR) 25 MG tablet Take 25 mg by mouth daily.     magnesium oxide (MAG-OX) 400 MG tablet Take 400 mg by mouth daily.      meclizine (ANTIVERT) 25 MG tablet Take 1 tablet (25 mg total) by mouth 3 (three) times daily as needed for dizziness. 30 tablet 0  methenamine (HIPREX) 1 g tablet Take 1 g by mouth 2 (two) times daily.     Multiple Vitamin (MULTIVITAMIN) tablet Take 1 tablet by mouth daily.       oxyCODONE-acetaminophen (PERCOCET) 10-325 MG tablet Take 1 tablet by mouth 4 (four) times daily as needed.     pantoprazole (PROTONIX) 40 MG tablet Take 40 mg by mouth 2 (two) times daily.     simvastatin (ZOCOR) 20 MG tablet Take 20 mg by mouth daily.     sodium chloride (OCEAN) 0.65 % SOLN nasal spray Place 1 spray into both nostrils as needed for congestion. (Patient not taking: Reported on 11/30/2021) 15 mL 10   vitamin B-12 (CYANOCOBALAMIN) 1000 MCG tablet Take 1,000 mcg by mouth daily.     No current facility-administered medications for this visit.     PHYSICAL EXAMINATION: ECOG PERFORMANCE STATUS: 0 - Asymptomatic  Vitals:   12/13/21 1007  BP: (!) 113/93  Pulse: 79  Resp: 16  Temp: 97.9 F (36.6 C)  SpO2: 100%   Filed Weights   12/13/21 1007  Weight: 166 lb 9.6 oz (75.6 kg)    GENERAL:alert, no distress and comfortable SKIN: skin color, texture, turgor are normal, no rashes or significant lesions EYES: normal, conjunctiva are pink and non-injected, sclera clear OROPHARYNX:no exudate, no erythema and lips, buccal mucosa, and tongue normal  NECK: supple, thyroid normal size, non-tender, without nodularity LYMPH:  no palpable  lymphadenopathy in the cervical, axillary LUNGS: clear to auscultation and percussion with normal breathing effort HEART: regular rate & rhythm and no murmurs and no lower extremity edema ABDOMEN:abdomen soft, non-tender and normal bowel sounds Musculoskeletal:no cyanosis of digits and no clubbing  PSYCH: alert & oriented x 3 with fluent speech NEURO: no focal motor/sensory deficits  LABORATORY DATA:  I have reviewed the data as listed Lab Results  Component Value Date   WBC 6.1 12/13/2021   HGB 12.3 12/13/2021   HCT 37.9 12/13/2021   MCV 85.9 12/13/2021   PLT 179 12/13/2021     Chemistry      Component Value Date/Time   NA 139 12/13/2021 1108   K 4.0 12/13/2021 1108   CL 102 12/13/2021 1108   CO2 32 12/13/2021 1108   BUN 11 12/13/2021 1108   CREATININE 0.76 12/13/2021 1108      Component Value Date/Time   CALCIUM 9.1 12/13/2021 1108   ALKPHOS 58 12/13/2021 1108   AST 27 12/13/2021 1108   ALT 17 12/13/2021 1108   BILITOT 0.6 12/13/2021 1108       RADIOGRAPHIC STUDIES: I have personally reviewed the radiological images as listed and agreed with the findings in the report. CT HEAD WO CONTRAST (5MM)  Result Date: 12/02/2021 CLINICAL DATA:  Dizziness and blurred vision EXAM: CT HEAD WITHOUT CONTRAST TECHNIQUE: Contiguous axial images were obtained from the base of the skull through the vertex without intravenous contrast. RADIATION DOSE REDUCTION: This exam was performed according to the departmental dose-optimization program which includes automated exposure control, adjustment of the mA and/or kV according to patient size and/or use of iterative reconstruction technique. COMPARISON:  CT head 01/29/2010 FINDINGS: Brain: No evidence of acute infarction, hemorrhage, hydrocephalus, extra-axial collection or mass lesion/mass effect. Vascular: Negative for hyperdense vessel Skull: Negative Sinuses/Orbits: Mild mucosal edema paranasal sinuses. Negative orbit Other: None IMPRESSION:  Negative CT of the brain. Electronically Signed   By: Franchot Gallo M.D.   On: 12/02/2021 11:25   CT Angio Abd/Pel W and/or Wo Contrast  Result Date:  11/29/2021 CLINICAL DATA:  GI bleed, lower. Rectal bleeding today with syncope. EXAM: CTA ABDOMEN AND PELVIS WITHOUT AND WITH CONTRAST TECHNIQUE: Multidetector CT imaging of the abdomen and pelvis was performed using the standard protocol during bolus administration of intravenous contrast. Multiplanar reconstructed images and MIPs were obtained and reviewed to evaluate the vascular anatomy. RADIATION DOSE REDUCTION: This exam was performed according to the departmental dose-optimization program which includes automated exposure control, adjustment of the mA and/or kV according to patient size and/or use of iterative reconstruction technique. CONTRAST:  184mL OMNIPAQUE IOHEXOL 350 MG/ML SOLN COMPARISON:  07/29/2016. FINDINGS: VASCULAR Aorta: Normal caliber aorta without aneurysm, dissection, vasculitis or significant stenosis. Celiac: Patent without evidence of aneurysm, dissection, vasculitis or significant stenosis. SMA: Patent without evidence of aneurysm, dissection, vasculitis or significant stenosis. Renals: Both renal arteries are patent without evidence of aneurysm, dissection, vasculitis, fibromuscular dysplasia or significant stenosis. IMA: Patent without evidence of aneurysm, dissection, vasculitis or significant stenosis. Hyperemia is noted involving and distal branch of the IMA with prominent IMV in the region of the mid to distal descending colon. Inflow: Patent without evidence of aneurysm, dissection, vasculitis or significant stenosis. Proximal Outflow: Bilateral common femoral and visualized portions of the superficial and profunda femoral arteries are patent without evidence of aneurysm, dissection, vasculitis or significant stenosis. Veins: The anterior mesenteric vein is prominent in the region of the mid to distal descending colon. Review of  the MIP images confirms the above findings. NON-VASCULAR Lower chest: Heart is normal in size. A calcified granuloma is noted in the right lower lobe. Minimal atelectasis or scarring at the lung bases. Hepatobiliary: No focal abnormality. Hepatic steatosis is noted. There is subtle nodularity of the inferior aspect of the liver, possible underlying cirrhosis. No biliary ductal dilatation. Gallbladder is without stones. Pancreas: Unremarkable. No pancreatic ductal dilatation or surrounding inflammatory changes. Spleen: Normal size. A small hypervascular focus is present in the spleen measuring 8 mm, likely hemangioma. Adrenals/Urinary Tract: The adrenal glands are within normal limits. A cyst is present in the mid left kidney. Renal cortical scarring is noted bilaterally. Bilateral renal calculi are noted without evidence of obstructive uropathy. The bladder the bladder is unremarkable. Stomach/Bowel: A gastric lap band is noted, with moderate herniation of the gastric pouch and lap band. No bowel obstruction, free air, or pneumatosis. No focal hemorrhage is identified. There is bowel wall thickening with surrounding inflammatory changes involving the descending colon. There is a long segment of hyperdense attenuation involving the segment of colon, concerning for contrast extravasation. Hyperemia is noted at the mid to distal descending colon involving a branch of the IMA. Normal appendix is seen in the right lower quadrant. Lymphatic: No abdominal or pelvic lymphadenopathy. Reproductive: Status post hysterectomy. No adnexal masses. Other: No abdominopelvic ascites. Musculoskeletal: Degenerative changes in the thoracolumbar spine. No acute osseous abnormality. IMPRESSION: VASCULAR 1. No significant arterial stenosis, aneurysm, or occlusion. 2. Hyperemia involving distal segment of the IMA and prominent IMV in the region the mid to distal descending colon. NON-VASCULAR 1. Colonic wall thickening with surrounding fat  stranding involving the mid to distal descending colon, compatible with colitis. Hyperemia is noted involving a branch of the IMA and prominent IMV in this region. Long segment of hyperdense attenuation is noted in the mid to distal descending colon on arterial and venous phases, concerning for contrast extravasation and possible active hemorrhage. Conventional angiography may be beneficial for further evaluation. 2. 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 3. Hepatic steatosis. Mild nodularity of the inferior aspect of the liver may be associated with underlying cirrhosis. 4. Bilateral nephrolithiasis. 5. Remaining incidental findings as described above. Electronically Signed   By: Brett Fairy M.D.   On: 11/29/2021 23:00    All questions were answered. The patient knows to call the clinic with any problems, questions or concerns. I spent 45 minutes in the care of this patient including H and P, review of records, counseling and coordination of care.     Benay Pike, MD 12/13/2021 1:08 PM

## 2021-12-14 DIAGNOSIS — U071 COVID-19: Secondary | ICD-10-CM | POA: Diagnosis not present

## 2021-12-14 DIAGNOSIS — Z09 Encounter for follow-up examination after completed treatment for conditions other than malignant neoplasm: Secondary | ICD-10-CM | POA: Diagnosis not present

## 2021-12-14 DIAGNOSIS — D649 Anemia, unspecified: Secondary | ICD-10-CM | POA: Diagnosis not present

## 2021-12-14 DIAGNOSIS — K922 Gastrointestinal hemorrhage, unspecified: Secondary | ICD-10-CM | POA: Diagnosis not present

## 2021-12-14 DIAGNOSIS — K559 Vascular disorder of intestine, unspecified: Secondary | ICD-10-CM | POA: Diagnosis not present

## 2021-12-14 LAB — KAPPA/LAMBDA LIGHT CHAINS
Kappa free light chain: 38.2 mg/L — ABNORMAL HIGH (ref 3.3–19.4)
Kappa, lambda light chain ratio: 1.66 — ABNORMAL HIGH (ref 0.26–1.65)
Lambda free light chains: 23 mg/L (ref 5.7–26.3)

## 2021-12-16 LAB — PROTEIN ELECTROPHORESIS, SERUM, WITH REFLEX
A/G Ratio: 1 (ref 0.7–1.7)
Albumin ELP: 3.4 g/dL (ref 2.9–4.4)
Alpha-1-Globulin: 0.3 g/dL (ref 0.0–0.4)
Alpha-2-Globulin: 0.8 g/dL (ref 0.4–1.0)
Beta Globulin: 1.1 g/dL (ref 0.7–1.3)
Gamma Globulin: 1.4 g/dL (ref 0.4–1.8)
Globulin, Total: 3.5 g/dL (ref 2.2–3.9)
Total Protein ELP: 6.9 g/dL (ref 6.0–8.5)

## 2021-12-21 ENCOUNTER — Other Ambulatory Visit: Payer: Self-pay

## 2021-12-21 DIAGNOSIS — R779 Abnormality of plasma protein, unspecified: Secondary | ICD-10-CM | POA: Diagnosis not present

## 2021-12-21 DIAGNOSIS — G8929 Other chronic pain: Secondary | ICD-10-CM | POA: Diagnosis not present

## 2021-12-21 DIAGNOSIS — E785 Hyperlipidemia, unspecified: Secondary | ICD-10-CM | POA: Diagnosis not present

## 2021-12-21 DIAGNOSIS — I10 Essential (primary) hypertension: Secondary | ICD-10-CM | POA: Diagnosis not present

## 2021-12-21 DIAGNOSIS — K449 Diaphragmatic hernia without obstruction or gangrene: Secondary | ICD-10-CM | POA: Diagnosis not present

## 2021-12-21 DIAGNOSIS — N200 Calculus of kidney: Secondary | ICD-10-CM | POA: Diagnosis not present

## 2021-12-21 DIAGNOSIS — G4730 Sleep apnea, unspecified: Secondary | ICD-10-CM | POA: Diagnosis not present

## 2021-12-21 DIAGNOSIS — Z8616 Personal history of COVID-19: Secondary | ICD-10-CM | POA: Diagnosis not present

## 2021-12-21 DIAGNOSIS — M1990 Unspecified osteoarthritis, unspecified site: Secondary | ICD-10-CM | POA: Diagnosis not present

## 2021-12-21 DIAGNOSIS — G4733 Obstructive sleep apnea (adult) (pediatric): Secondary | ICD-10-CM | POA: Diagnosis not present

## 2021-12-21 DIAGNOSIS — E7800 Pure hypercholesterolemia, unspecified: Secondary | ICD-10-CM | POA: Diagnosis not present

## 2021-12-21 DIAGNOSIS — R778 Other specified abnormalities of plasma proteins: Secondary | ICD-10-CM

## 2021-12-21 DIAGNOSIS — K219 Gastro-esophageal reflux disease without esophagitis: Secondary | ICD-10-CM | POA: Diagnosis not present

## 2021-12-27 DIAGNOSIS — Z5181 Encounter for therapeutic drug level monitoring: Secondary | ICD-10-CM | POA: Diagnosis not present

## 2021-12-27 DIAGNOSIS — M5030 Other cervical disc degeneration, unspecified cervical region: Secondary | ICD-10-CM | POA: Diagnosis not present

## 2021-12-27 DIAGNOSIS — M5136 Other intervertebral disc degeneration, lumbar region: Secondary | ICD-10-CM | POA: Diagnosis not present

## 2021-12-27 DIAGNOSIS — Z79899 Other long term (current) drug therapy: Secondary | ICD-10-CM | POA: Diagnosis not present

## 2021-12-27 LAB — UPEP/TP, 24-HR URINE
Albumin, U: 36.1 %
Alpha 1, Urine: 6.8 %
Alpha 2, Urine: 19.6 %
Beta, Urine: 23.4 %
Gamma Globulin, Urine: 14.1 %
Total Protein, Urine-Ur/day: 469 mg/24 hr — ABNORMAL HIGH (ref 30–150)
Total Protein, Urine: 27.6 mg/dL
Total Volume: 1700

## 2021-12-29 ENCOUNTER — Inpatient Hospital Stay (HOSPITAL_BASED_OUTPATIENT_CLINIC_OR_DEPARTMENT_OTHER): Payer: 59 | Admitting: Hematology and Oncology

## 2021-12-29 DIAGNOSIS — R778 Other specified abnormalities of plasma proteins: Secondary | ICD-10-CM | POA: Diagnosis not present

## 2021-12-29 NOTE — Progress Notes (Unsigned)
[facility] CONSULT NOTE  Patient Care Team: Carol Ada, MD as PCP - General (Family Medicine) Sueanne Margarita, MD as PCP - Cardiology (Cardiology)  CHIEF COMPLAINTS/PURPOSE OF CONSULTATION:  Abnormal SPEP  ASSESSMENT & PLAN:  No problem-specific Assessment & Plan notes found for this encounter.   No orders of the defined types were placed in this encounter.    HISTORY OF PRESENTING ILLNESS:  Tracey Powell 61 y.o. female is here because of abnormal SPEP.  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  Tracey Powell is here for an initial visit by herself.  She tells me that she feels good overall. She denies any fevers, drenching night sweats, loss of appetite or unintentional loss of weight.  She has some bilateral hip pain as well as back pain radiating down the leg but no definitive bone pains otherwise.  She continues to work full-time, had COVID-19 3 weeks ago and was also admitted to the hospital at around the same time with rectal bleeding and syncope.  She was followed by gastroenterology and was thought to have possible ischemic colitis which has since resolved. She has a sister who had breast cancer at a younger age however no other family history of cancer.  Rest of the pertinent 10 point ROS reviewed and negative.  MEDICAL HISTORY:  Past Medical History:  Diagnosis Date   Acid reflux    Anxiety    Chronic back pain    Complication of anesthesia    slow to wake up   Depressed    DJD (degenerative joint disease)    Hypercholesteremia    Hypertension    Kidney infection    Nephrolithiasis    OSA  (obstructive sleep apnea)    mild with AHI 13/hr now on CPAP at 11cm H2O   Urolithiasis     SURGICAL HISTORY: Past Surgical History:  Procedure Laterality Date   ABDOMINAL HYSTERECTOMY     ELBOW SURGERY     EXTRACORPOREAL SHOCK WAVE LITHOTRIPSY Right 07/11/2019   Procedure: EXTRACORPOREAL SHOCK WAVE LITHOTRIPSY (ESWL);  Surgeon: Ceasar Mons, MD;  Location: [facility];  Service: Urology;  Laterality: Right;   EXTRACORPOREAL SHOCK WAVE LITHOTRIPSY Left 08/01/2019   Procedure: LEFT EXTRACORPOREAL SHOCK WAVE LITHOTRIPSY (ESWL);  Surgeon: Festus Aloe, MD;  Location: [facility];  Service: Urology;  Laterality: Left;   KNEE SURGERY     LAPAROSCOPIC GASTRIC BANDING     LITHOTRIPSY      SOCIAL HISTORY: Social History   Socioeconomic History   Marital status: Married    Spouse name: Not on file   Number of children: Not on file   Years of education: Not on file   Highest education level: Not on file  Occupational History   Not on file  Tobacco Use   Smoking status: Never   Smokeless tobacco: Never  Substance and Sexual Activity   Alcohol use: No   Drug use: No   Sexual activity: Yes  Other Topics Concern   Not on file  Social History Narrative   Not on file   Social Determinants of Health   Financial Resource Strain: Not on file  Food Insecurity: No Food Insecurity (12/01/2021)  Hunger Vital Sign    Worried About Running Out of Food in the Last Year: Never true    Ran Out of Food in the Last Year: Never true  Transportation Needs: No Transportation Needs (12/01/2021)   PRAPARE - Hydrologist (Medical): No    Lack of Transportation (Non-Medical): No  Physical Activity: Not on file  Stress: Not on file  Social Connections: Not on file  Intimate Partner Violence: Not At Risk (12/01/2021)   Humiliation, Afraid, Rape, and Kick questionnaire    Fear of Current or Ex-Partner: No    Emotionally Abused:  No    Physically Abused: No    Sexually Abused: No    FAMILY HISTORY: Family History  Problem Relation Age of Onset   Stroke Mother    CAD Father    CAD Brother     ALLERGIES:  is allergic to shingrix [zoster vac recomb adjuvanted].  MEDICATIONS:  Current Outpatient Medications  Medication Sig Dispense Refill   Cholecalciferol (VITAMIN D) 50 MCG (2000 UT) CAPS Take 2,000 Units by mouth daily.     DULoxetine (CYMBALTA) 30 MG capsule Take 90 mg by mouth daily.     famotidine (PEPCID) 20 MG tablet Take 20 mg by mouth at bedtime.     losartan (COZAAR) 25 MG tablet Take 25 mg by mouth daily.     magnesium oxide (MAG-OX) 400 MG tablet Take 400 mg by mouth daily.      meclizine (ANTIVERT) 25 MG tablet Take 1 tablet (25 mg total) by mouth 3 (three) times daily as needed for dizziness. 30 tablet 0   methenamine (HIPREX) 1 g tablet Take 1 g by mouth 2 (two) times daily.     Multiple Vitamin (MULTIVITAMIN) tablet Take 1 tablet by mouth daily.       oxyCODONE-acetaminophen (PERCOCET) 10-325 MG tablet Take 1 tablet by mouth 4 (four) times daily as needed.     pantoprazole (PROTONIX) 40 MG tablet Take 40 mg by mouth 2 (two) times daily.     simvastatin (ZOCOR) 20 MG tablet Take 20 mg by mouth daily.     sodium chloride (OCEAN) 0.65 % SOLN nasal spray Place 1 spray into both nostrils as needed for congestion. (Patient not taking: Reported on 11/30/2021) 15 mL 10   vitamin B-12 (CYANOCOBALAMIN) 1000 MCG tablet Take 1,000 mcg by mouth daily.     No current facility-administered medications for this visit.     PHYSICAL EXAMINATION: ECOG PERFORMANCE STATUS: 0 - Asymptomatic  There were no vitals filed for this visit.  There were no vitals filed for this visit.   GENERAL:alert, no distress and comfortable SKIN: skin color, texture, turgor are normal, no rashes or significant lesions EYES: normal, conjunctiva are pink and non-injected, sclera clear OROPHARYNX:no exudate, no erythema and  lips, buccal mucosa, and tongue normal  NECK: supple, thyroid normal size, non-tender, without nodularity LYMPH:  no palpable lymphadenopathy in the cervical, axillary LUNGS: clear to auscultation and percussion with normal breathing effort HEART: regular rate & rhythm and no murmurs and no lower extremity edema ABDOMEN:abdomen soft, non-tender and normal bowel sounds Musculoskeletal:no cyanosis of digits and no clubbing  PSYCH: alert & oriented x 3 with fluent speech NEURO: no focal motor/sensory deficits  LABORATORY DATA:  I have reviewed the data as listed Lab Results  Component Value Date   WBC 6.1 12/13/2021   HGB 12.3 12/13/2021   HCT 37.9 12/13/2021   MCV 85.9 12/13/2021   PLT  179 12/13/2021     Chemistry      Component Value Date/Time   NA 139 12/13/2021 1108   K 4.0 12/13/2021 1108   CL 102 12/13/2021 1108   CO2 32 12/13/2021 1108   BUN 11 12/13/2021 1108   CREATININE 0.76 12/13/2021 1108      Component Value Date/Time   CALCIUM 9.1 12/13/2021 1108   ALKPHOS 58 12/13/2021 1108   AST 27 12/13/2021 1108   ALT 17 12/13/2021 1108   BILITOT 0.6 12/13/2021 1108       RADIOGRAPHIC STUDIES: I have personally reviewed the radiological images as listed and agreed with the findings in the report. CT HEAD WO CONTRAST (5MM)  Result Date: 12/02/2021 CLINICAL DATA:  Dizziness and blurred vision EXAM: CT HEAD WITHOUT CONTRAST TECHNIQUE: Contiguous axial images were obtained from the base of the skull through the vertex without intravenous contrast. RADIATION DOSE REDUCTION: This exam was performed according to the departmental dose-optimization program which includes automated exposure control, adjustment of the mA and/or kV according to patient size and/or use of iterative reconstruction technique. COMPARISON:  CT head 01/29/2010 FINDINGS: Brain: No evidence of acute infarction, hemorrhage, hydrocephalus, extra-axial collection or mass lesion/mass effect. Vascular: Negative for  hyperdense vessel Skull: Negative Sinuses/Orbits: Mild mucosal edema paranasal sinuses. Negative orbit Other: None IMPRESSION: Negative CT of the brain. Electronically Signed   By: Franchot Gallo M.D.   On: 12/02/2021 11:25   CT Angio Abd/Pel W and/or Wo Contrast  Result Date: 11/29/2021 CLINICAL DATA:  GI bleed, lower. Rectal bleeding today with syncope. EXAM: CTA ABDOMEN AND PELVIS WITHOUT AND WITH CONTRAST TECHNIQUE: Multidetector CT imaging of the abdomen and pelvis was performed using the standard protocol during bolus administration of intravenous contrast. Multiplanar reconstructed images and MIPs were obtained and reviewed to evaluate the vascular anatomy. RADIATION DOSE REDUCTION: This exam was performed according to the departmental dose-optimization program which includes automated exposure control, adjustment of the mA and/or kV according to patient size and/or use of iterative reconstruction technique. CONTRAST:  191mL OMNIPAQUE IOHEXOL 350 MG/ML SOLN COMPARISON:  07/29/2016. FINDINGS: VASCULAR Aorta: Normal caliber aorta without aneurysm, dissection, vasculitis or significant stenosis. Celiac: Patent without evidence of aneurysm, dissection, vasculitis or significant stenosis. SMA: Patent without evidence of aneurysm, dissection, vasculitis or significant stenosis. Renals: Both renal arteries are patent without evidence of aneurysm, dissection, vasculitis, fibromuscular dysplasia or significant stenosis. IMA: Patent without evidence of aneurysm, dissection, vasculitis or significant stenosis. Hyperemia is noted involving and distal branch of the IMA with prominent IMV in the region of the mid to distal descending colon. Inflow: Patent without evidence of aneurysm, dissection, vasculitis or significant stenosis. Proximal Outflow: Bilateral common femoral and visualized portions of the superficial and profunda femoral arteries are patent without evidence of aneurysm, dissection, vasculitis or  significant stenosis. Veins: The anterior mesenteric vein is prominent in the region of the mid to distal descending colon. Review of the MIP images confirms the above findings. NON-VASCULAR Lower chest: Heart is normal in size. A calcified granuloma is noted in the right lower lobe. Minimal atelectasis or scarring at the lung bases. Hepatobiliary: No focal abnormality. Hepatic steatosis is noted. There is subtle nodularity of the inferior aspect of the liver, possible underlying cirrhosis. No biliary ductal dilatation. Gallbladder is without stones. Pancreas: Unremarkable. No pancreatic ductal dilatation or surrounding inflammatory changes. Spleen: Normal size. A small hypervascular focus is present in the spleen measuring 8 mm, likely hemangioma. Adrenals/Urinary Tract: The adrenal glands are within normal limits.  A cyst is present in the mid left kidney. Renal cortical scarring is noted bilaterally. Bilateral renal calculi are noted without evidence of obstructive uropathy. The bladder the bladder is unremarkable. Stomach/Bowel: A gastric lap band is noted, with moderate herniation of the gastric pouch and lap band. No bowel obstruction, free air, or pneumatosis. No focal hemorrhage is identified. There is bowel wall thickening with surrounding inflammatory changes involving the descending colon. There is a long segment of hyperdense attenuation involving the segment of colon, concerning for contrast extravasation. Hyperemia is noted at the mid to distal descending colon involving a branch of the IMA. Normal appendix is seen in the right lower quadrant. Lymphatic: No abdominal or pelvic lymphadenopathy. Reproductive: Status post hysterectomy. No adnexal masses. Other: No abdominopelvic ascites. Musculoskeletal: Degenerative changes in the thoracolumbar spine. No acute osseous abnormality. IMPRESSION: VASCULAR 1. No significant arterial stenosis, aneurysm, or occlusion. 2. Hyperemia involving distal segment of the  IMA and prominent IMV in the region the mid to distal descending colon. NON-VASCULAR 1. Colonic wall thickening with surrounding fat stranding involving the mid to distal descending colon, compatible with colitis. Hyperemia is noted involving a branch of the IMA and prominent IMV in this region. Long segment of hyperdense attenuation is noted in the mid to distal descending colon on arterial and venous phases, concerning for contrast extravasation and possible active hemorrhage. Conventional angiography may be beneficial for further evaluation. 2. 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 3. Hepatic steatosis. Mild nodularity of the inferior aspect of the liver may be associated with underlying cirrhosis. 4. Bilateral nephrolithiasis. 5. Remaining incidental findings as described above. Electronically Signed   By: Brett Fairy M.D.   On: 11/29/2021 23:00    All questions were answered. The patient knows to call the clinic with any problems, questions or concerns. I spent 45 minutes in the care of this patient including H and P, review of records, counseling and coordination of care.     Benay Pike, MD 12/29/2021 3:27 PM

## 2021-12-30 ENCOUNTER — Encounter: Payer: Self-pay | Admitting: Hematology and Oncology

## 2021-12-30 NOTE — Assessment & Plan Note (Signed)
This is a very pleasant 61 year old female patient with hypertension, dyslipidemia, obstructive sleep apnea referred to hematology for evaluation of abnormal SPEP.  She is here for telephone visit to review labs.  No evidence of monoclonal protein and serum and urine.  We have discussed about abnormal kappa lambda ratio but not already consistent with myeloma related changes.  No other evidence of endorgan damage.  Hence we have discussed about repeating labs in about 1 year.  We have discussed that there is no conclusive evidence of monoclonal gammopathy in this situation.  Her elevated beta-2 globulin, calcium levels are all corrected now.  She expressed understanding of the recommendations.  In basket message sent to our scheduling team for follow-up in 1 year with repeat labs.

## 2022-02-11 DIAGNOSIS — M25552 Pain in left hip: Secondary | ICD-10-CM | POA: Diagnosis not present

## 2022-04-13 DIAGNOSIS — M5136 Other intervertebral disc degeneration, lumbar region: Secondary | ICD-10-CM | POA: Diagnosis not present

## 2022-04-13 DIAGNOSIS — Z79891 Long term (current) use of opiate analgesic: Secondary | ICD-10-CM | POA: Diagnosis not present

## 2022-04-13 DIAGNOSIS — M79642 Pain in left hand: Secondary | ICD-10-CM | POA: Diagnosis not present

## 2022-04-13 DIAGNOSIS — M5030 Other cervical disc degeneration, unspecified cervical region: Secondary | ICD-10-CM | POA: Diagnosis not present

## 2022-04-20 ENCOUNTER — Other Ambulatory Visit: Payer: Self-pay | Admitting: Gastroenterology

## 2022-04-20 DIAGNOSIS — Z86010 Personal history of colonic polyps: Secondary | ICD-10-CM | POA: Diagnosis not present

## 2022-04-20 DIAGNOSIS — K219 Gastro-esophageal reflux disease without esophagitis: Secondary | ICD-10-CM | POA: Diagnosis not present

## 2022-04-20 DIAGNOSIS — R1310 Dysphagia, unspecified: Secondary | ICD-10-CM | POA: Diagnosis not present

## 2022-05-27 DIAGNOSIS — R8271 Bacteriuria: Secondary | ICD-10-CM | POA: Diagnosis not present

## 2022-05-27 DIAGNOSIS — N3021 Other chronic cystitis with hematuria: Secondary | ICD-10-CM | POA: Diagnosis not present

## 2022-05-30 DIAGNOSIS — G4733 Obstructive sleep apnea (adult) (pediatric): Secondary | ICD-10-CM | POA: Diagnosis not present

## 2022-05-30 DIAGNOSIS — I10 Essential (primary) hypertension: Secondary | ICD-10-CM | POA: Diagnosis not present

## 2022-05-30 DIAGNOSIS — Z0000 Encounter for general adult medical examination without abnormal findings: Secondary | ICD-10-CM | POA: Diagnosis not present

## 2022-05-30 DIAGNOSIS — R7309 Other abnormal glucose: Secondary | ICD-10-CM | POA: Diagnosis not present

## 2022-05-30 DIAGNOSIS — E785 Hyperlipidemia, unspecified: Secondary | ICD-10-CM | POA: Diagnosis not present

## 2022-05-30 DIAGNOSIS — R7303 Prediabetes: Secondary | ICD-10-CM | POA: Diagnosis not present

## 2022-05-30 DIAGNOSIS — F419 Anxiety disorder, unspecified: Secondary | ICD-10-CM | POA: Diagnosis not present

## 2022-05-30 DIAGNOSIS — K2100 Gastro-esophageal reflux disease with esophagitis, without bleeding: Secondary | ICD-10-CM | POA: Diagnosis not present

## 2022-06-22 ENCOUNTER — Encounter (HOSPITAL_COMMUNITY): Payer: Self-pay | Admitting: Gastroenterology

## 2022-06-22 ENCOUNTER — Encounter (HOSPITAL_COMMUNITY)
Admission: RE | Disposition: A | Discharge status: Home / Self Care | Payer: Self-pay | Source: Home / Self Care | Attending: Gastroenterology

## 2022-06-22 ENCOUNTER — Ambulatory Visit (HOSPITAL_COMMUNITY)
Admission: RE | Admit: 2022-06-22 | Discharge: 2022-06-22 | Disposition: A | Discharge status: Home / Self Care | Payer: 59 | Attending: Gastroenterology | Admitting: Gastroenterology

## 2022-06-22 DIAGNOSIS — R1310 Dysphagia, unspecified: Secondary | ICD-10-CM | POA: Insufficient documentation

## 2022-06-22 DIAGNOSIS — Z539 Procedure and treatment not carried out, unspecified reason: Secondary | ICD-10-CM | POA: Insufficient documentation

## 2022-06-22 SURGERY — INVASIVE LAB ABORTED CASE

## 2022-06-22 MED ORDER — LIDOCAINE VISCOUS HCL 2 % MT SOLN
OROMUCOSAL | Status: AC
  Filled 2022-06-22: qty 15

## 2022-06-22 SURGICAL SUPPLY — 2 items
FACESHIELD LNG OPTICON STERILE (SAFETY) IMPLANT
GLOVE BIO SURGEON STRL SZ8 (GLOVE) ×4 IMPLANT

## 2022-06-22 NOTE — Progress Notes (Signed)
Esophageal Manometry attempted per protocol. Unable to pass probe pass 40-45 cm without coiling and discomfort.  Second manometry nurse also attempted without success.  Physician notified.  Patient verbalized understanding and will discuss options with provider.

## 2022-07-04 DIAGNOSIS — N200 Calculus of kidney: Secondary | ICD-10-CM | POA: Diagnosis not present

## 2022-07-04 DIAGNOSIS — N3020 Other chronic cystitis without hematuria: Secondary | ICD-10-CM | POA: Diagnosis not present

## 2022-08-17 DIAGNOSIS — Z5181 Encounter for therapeutic drug level monitoring: Secondary | ICD-10-CM | POA: Diagnosis not present

## 2022-08-17 DIAGNOSIS — M5136 Other intervertebral disc degeneration, lumbar region: Secondary | ICD-10-CM | POA: Diagnosis not present

## 2022-08-17 DIAGNOSIS — Z79891 Long term (current) use of opiate analgesic: Secondary | ICD-10-CM | POA: Diagnosis not present

## 2022-08-17 DIAGNOSIS — M5030 Other cervical disc degeneration, unspecified cervical region: Secondary | ICD-10-CM | POA: Diagnosis not present

## 2022-10-06 DIAGNOSIS — G4733 Obstructive sleep apnea (adult) (pediatric): Secondary | ICD-10-CM | POA: Diagnosis not present

## 2022-10-21 DIAGNOSIS — M13841 Other specified arthritis, right hand: Secondary | ICD-10-CM | POA: Diagnosis not present

## 2022-11-14 DIAGNOSIS — M8588 Other specified disorders of bone density and structure, other site: Secondary | ICD-10-CM | POA: Diagnosis not present

## 2022-11-14 DIAGNOSIS — Z1231 Encounter for screening mammogram for malignant neoplasm of breast: Secondary | ICD-10-CM | POA: Diagnosis not present

## 2022-11-14 DIAGNOSIS — Z8262 Family history of osteoporosis: Secondary | ICD-10-CM | POA: Diagnosis not present

## 2022-12-01 DIAGNOSIS — Z23 Encounter for immunization: Secondary | ICD-10-CM | POA: Diagnosis not present

## 2022-12-01 DIAGNOSIS — K2100 Gastro-esophageal reflux disease with esophagitis, without bleeding: Secondary | ICD-10-CM | POA: Diagnosis not present

## 2022-12-01 DIAGNOSIS — R7303 Prediabetes: Secondary | ICD-10-CM | POA: Diagnosis not present

## 2022-12-01 DIAGNOSIS — G4733 Obstructive sleep apnea (adult) (pediatric): Secondary | ICD-10-CM | POA: Diagnosis not present

## 2022-12-01 DIAGNOSIS — F419 Anxiety disorder, unspecified: Secondary | ICD-10-CM | POA: Diagnosis not present

## 2022-12-01 DIAGNOSIS — E785 Hyperlipidemia, unspecified: Secondary | ICD-10-CM | POA: Diagnosis not present

## 2022-12-01 DIAGNOSIS — I10 Essential (primary) hypertension: Secondary | ICD-10-CM | POA: Diagnosis not present

## 2022-12-19 DIAGNOSIS — M5030 Other cervical disc degeneration, unspecified cervical region: Secondary | ICD-10-CM | POA: Diagnosis not present

## 2022-12-19 DIAGNOSIS — M51369 Other intervertebral disc degeneration, lumbar region without mention of lumbar back pain or lower extremity pain: Secondary | ICD-10-CM | POA: Diagnosis not present

## 2022-12-19 DIAGNOSIS — Z79891 Long term (current) use of opiate analgesic: Secondary | ICD-10-CM | POA: Diagnosis not present

## 2022-12-19 DIAGNOSIS — Z79899 Other long term (current) drug therapy: Secondary | ICD-10-CM | POA: Diagnosis not present

## 2022-12-19 DIAGNOSIS — Z5181 Encounter for therapeutic drug level monitoring: Secondary | ICD-10-CM | POA: Diagnosis not present

## 2022-12-30 ENCOUNTER — Other Ambulatory Visit: Payer: Self-pay | Admitting: *Deleted

## 2022-12-30 DIAGNOSIS — R778 Other specified abnormalities of plasma proteins: Secondary | ICD-10-CM

## 2023-01-03 ENCOUNTER — Inpatient Hospital Stay: Payer: 59 | Attending: Family Medicine

## 2023-01-03 ENCOUNTER — Inpatient Hospital Stay (HOSPITAL_BASED_OUTPATIENT_CLINIC_OR_DEPARTMENT_OTHER): Payer: 59 | Admitting: Hematology and Oncology

## 2023-01-03 DIAGNOSIS — R778 Other specified abnormalities of plasma proteins: Secondary | ICD-10-CM

## 2023-01-06 DIAGNOSIS — R1110 Vomiting, unspecified: Secondary | ICD-10-CM | POA: Diagnosis not present

## 2023-01-06 DIAGNOSIS — K2100 Gastro-esophageal reflux disease with esophagitis, without bleeding: Secondary | ICD-10-CM | POA: Diagnosis not present

## 2023-01-06 DIAGNOSIS — R1310 Dysphagia, unspecified: Secondary | ICD-10-CM | POA: Diagnosis not present

## 2023-01-18 NOTE — Progress Notes (Signed)
No show

## 2023-03-05 DIAGNOSIS — R300 Dysuria: Secondary | ICD-10-CM | POA: Diagnosis not present

## 2023-03-05 DIAGNOSIS — N3000 Acute cystitis without hematuria: Secondary | ICD-10-CM | POA: Diagnosis not present

## 2023-03-05 DIAGNOSIS — R102 Pelvic and perineal pain: Secondary | ICD-10-CM | POA: Diagnosis not present

## 2023-03-05 DIAGNOSIS — Z7689 Persons encountering health services in other specified circumstances: Secondary | ICD-10-CM | POA: Diagnosis not present

## 2023-03-13 DIAGNOSIS — R1310 Dysphagia, unspecified: Secondary | ICD-10-CM | POA: Diagnosis not present

## 2023-03-13 DIAGNOSIS — K225 Diverticulum of esophagus, acquired: Secondary | ICD-10-CM | POA: Diagnosis not present

## 2023-03-13 DIAGNOSIS — K219 Gastro-esophageal reflux disease without esophagitis: Secondary | ICD-10-CM | POA: Diagnosis not present

## 2023-03-13 DIAGNOSIS — G4733 Obstructive sleep apnea (adult) (pediatric): Secondary | ICD-10-CM | POA: Diagnosis not present

## 2023-03-13 DIAGNOSIS — F419 Anxiety disorder, unspecified: Secondary | ICD-10-CM | POA: Diagnosis not present

## 2023-03-13 DIAGNOSIS — E785 Hyperlipidemia, unspecified: Secondary | ICD-10-CM | POA: Diagnosis not present

## 2023-03-13 DIAGNOSIS — Z9989 Dependence on other enabling machines and devices: Secondary | ICD-10-CM | POA: Diagnosis not present

## 2023-03-13 DIAGNOSIS — R1110 Vomiting, unspecified: Secondary | ICD-10-CM | POA: Diagnosis not present

## 2023-03-13 DIAGNOSIS — Z79899 Other long term (current) drug therapy: Secondary | ICD-10-CM | POA: Diagnosis not present

## 2023-03-13 DIAGNOSIS — I10 Essential (primary) hypertension: Secondary | ICD-10-CM | POA: Diagnosis not present

## 2023-04-06 DIAGNOSIS — R8271 Bacteriuria: Secondary | ICD-10-CM | POA: Diagnosis not present

## 2023-04-06 DIAGNOSIS — N3020 Other chronic cystitis without hematuria: Secondary | ICD-10-CM | POA: Diagnosis not present

## 2023-04-14 ENCOUNTER — Ambulatory Visit: Payer: 59 | Admitting: Podiatry

## 2023-04-14 ENCOUNTER — Encounter: Payer: Self-pay | Admitting: Podiatry

## 2023-04-14 DIAGNOSIS — L600 Ingrowing nail: Secondary | ICD-10-CM

## 2023-04-14 NOTE — Patient Instructions (Signed)

## 2023-04-17 NOTE — Progress Notes (Signed)
 Subjective:   Patient ID: Tracey Powell, female   DOB: 63 y.o.   MRN: [id number]   HPI Patient presents with painful ingrown toenail deformity left big toe that she has tried to trim and soak without relief of symptoms.  Patient does not smoke likes to be active   Review of Systems  All other systems reviewed and are negative.       Objective:  Physical Exam Vitals and nursing note reviewed.  Constitutional:      Appearance: She is well-developed.  Pulmonary:     Effort: Pulmonary effort is normal.  Musculoskeletal:        General: Normal range of motion.  Skin:    General: Skin is warm.  Neurological:     Mental Status: She is alert.     Neurovascular status intact muscle strength adequate range of motion adequate incurvated medial border left big toe painful when pressed no erythema no edema drainage associated with it.  Good digital perfusion well-oriented     Assessment:  Ingrown toenail deformity left hallux medial border painful     Plan:  H&P reviewed patient wants correction and I discussed this with her allowing her to read consent form going over all possible complications.  Patient signed consent form today I infiltrated the left big toe 60 mg like Marcaine mixture sterile prep done using sterile instrumentation remove the border exposed matrix applied phenol 3 applications 30 seconds followed by alcohol lavage sterile dressing gave instructions on soaks wear dressing 24 hours take it off earlier if throbbing were to occur and encouraged to call with questions concerns which may arise

## 2023-04-21 DIAGNOSIS — R8271 Bacteriuria: Secondary | ICD-10-CM | POA: Diagnosis not present

## 2023-04-24 DIAGNOSIS — M5030 Other cervical disc degeneration, unspecified cervical region: Secondary | ICD-10-CM | POA: Diagnosis not present

## 2023-04-24 DIAGNOSIS — Z79891 Long term (current) use of opiate analgesic: Secondary | ICD-10-CM | POA: Diagnosis not present

## 2023-04-24 DIAGNOSIS — Z5181 Encounter for therapeutic drug level monitoring: Secondary | ICD-10-CM | POA: Diagnosis not present

## 2023-04-24 DIAGNOSIS — M51369 Other intervertebral disc degeneration, lumbar region without mention of lumbar back pain or lower extremity pain: Secondary | ICD-10-CM | POA: Diagnosis not present

## 2023-04-28 DIAGNOSIS — K229 Disease of esophagus, unspecified: Secondary | ICD-10-CM | POA: Diagnosis not present

## 2023-04-28 DIAGNOSIS — R918 Other nonspecific abnormal finding of lung field: Secondary | ICD-10-CM | POA: Diagnosis not present

## 2023-04-28 DIAGNOSIS — K224 Dyskinesia of esophagus: Secondary | ICD-10-CM | POA: Diagnosis not present

## 2023-04-28 DIAGNOSIS — K449 Diaphragmatic hernia without obstruction or gangrene: Secondary | ICD-10-CM | POA: Diagnosis not present

## 2023-06-12 DIAGNOSIS — R1314 Dysphagia, pharyngoesophageal phase: Secondary | ICD-10-CM | POA: Diagnosis not present

## 2023-06-12 DIAGNOSIS — K2289 Other specified disease of esophagus: Secondary | ICD-10-CM | POA: Diagnosis not present

## 2023-06-12 DIAGNOSIS — K224 Dyskinesia of esophagus: Secondary | ICD-10-CM | POA: Diagnosis not present

## 2023-06-12 DIAGNOSIS — K3189 Other diseases of stomach and duodenum: Secondary | ICD-10-CM | POA: Diagnosis not present

## 2023-06-12 DIAGNOSIS — K225 Diverticulum of esophagus, acquired: Secondary | ICD-10-CM | POA: Diagnosis not present

## 2023-06-12 DIAGNOSIS — K219 Gastro-esophageal reflux disease without esophagitis: Secondary | ICD-10-CM | POA: Diagnosis not present

## 2023-06-26 DIAGNOSIS — K2100 Gastro-esophageal reflux disease with esophagitis, without bleeding: Secondary | ICD-10-CM | POA: Diagnosis not present

## 2023-06-26 DIAGNOSIS — G4733 Obstructive sleep apnea (adult) (pediatric): Secondary | ICD-10-CM | POA: Diagnosis not present

## 2023-06-26 DIAGNOSIS — I10 Essential (primary) hypertension: Secondary | ICD-10-CM | POA: Diagnosis not present

## 2023-06-26 DIAGNOSIS — Z0000 Encounter for general adult medical examination without abnormal findings: Secondary | ICD-10-CM | POA: Diagnosis not present

## 2023-06-26 DIAGNOSIS — Z23 Encounter for immunization: Secondary | ICD-10-CM | POA: Diagnosis not present

## 2023-06-26 DIAGNOSIS — E785 Hyperlipidemia, unspecified: Secondary | ICD-10-CM | POA: Diagnosis not present

## 2023-06-26 DIAGNOSIS — R7303 Prediabetes: Secondary | ICD-10-CM | POA: Diagnosis not present

## 2023-06-26 DIAGNOSIS — F419 Anxiety disorder, unspecified: Secondary | ICD-10-CM | POA: Diagnosis not present

## 2023-07-14 DIAGNOSIS — I10 Essential (primary) hypertension: Secondary | ICD-10-CM | POA: Diagnosis not present

## 2023-07-14 DIAGNOSIS — K3189 Other diseases of stomach and duodenum: Secondary | ICD-10-CM | POA: Diagnosis not present

## 2023-07-14 DIAGNOSIS — E785 Hyperlipidemia, unspecified: Secondary | ICD-10-CM | POA: Diagnosis not present

## 2023-07-14 DIAGNOSIS — R1310 Dysphagia, unspecified: Secondary | ICD-10-CM | POA: Diagnosis not present

## 2023-07-14 DIAGNOSIS — F419 Anxiety disorder, unspecified: Secondary | ICD-10-CM | POA: Diagnosis not present

## 2023-07-14 DIAGNOSIS — K220 Achalasia of cardia: Secondary | ICD-10-CM | POA: Diagnosis not present

## 2023-07-14 DIAGNOSIS — K2289 Other specified disease of esophagus: Secondary | ICD-10-CM | POA: Diagnosis not present

## 2023-07-14 DIAGNOSIS — K219 Gastro-esophageal reflux disease without esophagitis: Secondary | ICD-10-CM | POA: Diagnosis not present

## 2023-07-14 DIAGNOSIS — D649 Anemia, unspecified: Secondary | ICD-10-CM | POA: Diagnosis not present

## 2023-07-14 DIAGNOSIS — Z79899 Other long term (current) drug therapy: Secondary | ICD-10-CM | POA: Diagnosis not present

## 2023-08-01 DIAGNOSIS — J069 Acute upper respiratory infection, unspecified: Secondary | ICD-10-CM | POA: Diagnosis not present

## 2023-08-22 DIAGNOSIS — M549 Dorsalgia, unspecified: Secondary | ICD-10-CM | POA: Diagnosis not present

## 2023-08-22 DIAGNOSIS — M5030 Other cervical disc degeneration, unspecified cervical region: Secondary | ICD-10-CM | POA: Diagnosis not present

## 2023-08-22 DIAGNOSIS — M51369 Other intervertebral disc degeneration, lumbar region without mention of lumbar back pain or lower extremity pain: Secondary | ICD-10-CM | POA: Diagnosis not present

## 2023-08-23 DIAGNOSIS — K449 Diaphragmatic hernia without obstruction or gangrene: Secondary | ICD-10-CM | POA: Diagnosis not present

## 2023-08-23 DIAGNOSIS — K2289 Other specified disease of esophagus: Secondary | ICD-10-CM | POA: Diagnosis not present

## 2023-08-23 DIAGNOSIS — K224 Dyskinesia of esophagus: Secondary | ICD-10-CM | POA: Diagnosis not present

## 2023-08-23 DIAGNOSIS — T18128A Food in esophagus causing other injury, initial encounter: Secondary | ICD-10-CM | POA: Diagnosis not present

## 2023-10-13 ENCOUNTER — Encounter: Payer: Self-pay | Admitting: Cardiology

## 2023-10-13 ENCOUNTER — Ambulatory Visit: Attending: Cardiology | Admitting: Cardiology

## 2023-10-13 VITALS — BP 116/72 | HR 61 | Ht 65.0 in | Wt 166.2 lb

## 2023-10-13 DIAGNOSIS — G4733 Obstructive sleep apnea (adult) (pediatric): Secondary | ICD-10-CM | POA: Diagnosis not present

## 2023-10-13 DIAGNOSIS — N3020 Other chronic cystitis without hematuria: Secondary | ICD-10-CM | POA: Diagnosis not present

## 2023-10-13 DIAGNOSIS — I10 Essential (primary) hypertension: Secondary | ICD-10-CM | POA: Diagnosis not present

## 2023-10-13 NOTE — Patient Instructions (Signed)
 Medication Instructions:  Your physician recommends that you continue on your current medications as directed. Please refer to the Current Medication list given to you today.  *If you need a refill on your cardiac medications before your next appointment, please call your pharmacy*  Lab Work: None.  If you have labs (blood work) drawn today and your tests are completely normal, you will receive your results only by: MyChart Message (if you have MyChart) OR A paper copy in the mail If you have any lab test that is abnormal or we need to change your treatment, we will call you to review the results.  Testing/Procedures: None.  Follow-Up: At [facility], you and your health needs are our priority.  As part of our continuing mission to provide you with exceptional heart care, our providers are all part of one team.  This team includes your primary Cardiologist (physician) and Advanced Practice Providers or APPs (Physician Assistants and Nurse Practitioners) who all work together to provide you with the care you need, when you need it.  Your next appointment will be 6 weeks after delivery of your cpap device and it will be with:     Provider:   Wilbert Bihari, MD

## 2023-10-13 NOTE — Progress Notes (Signed)
 Evaluation Performed:  Follow-up visit  Date:  10/13/2023   ID:  Tracey Powell, DOB 07/06/1960, MRN [id number]  PCP:  [name], MD  Sleep medicine:  Wilbert Bihari, MD Electrophysiologist:  None   Chief Complaint:  OSA  History of Present Illness:    Tracey Powell is a 63 y.o. female with a hx of mild OSA with an AHI of 13/hr and has been on CPAP at 33cm H2O.   She is doing well with her PAP device and thinks that she has gotten used to it.  She tolerates the nasal mask and feels the pressure is adequate.  Since going on PAP she feels rested in the am and has no significant daytime sleepiness.  She denies any significant nasal dryness or nasal congestion.  She does have a lot of mouth dryness despite using a chin strap which is old.  She has been grinding her teeth at night and her jaw hurts in the am.  Patient denies any episodes of restless legs, No gagging hallucinations or cataplectic events.  Her Epworth sleepiness score today is 6 that would go against significant daytime sleepiness. Her device is over 68 years old so she wants to get a new device. She does have some problems with am HAs.   Prior CV studies:   The following studies were reviewed today:  PAP compliance download  Past Medical History:  Diagnosis Date   Acid reflux    Anxiety    Chronic back pain    Complication of anesthesia    slow to wake up   Depressed    DJD (degenerative joint disease)    Hypercholesteremia    Hypertension    Kidney infection    Nephrolithiasis    OSA (obstructive sleep apnea)    mild with AHI 13/hr now on CPAP at 11cm H2O   Urolithiasis    Past Surgical History:  Procedure Laterality Date   ABDOMINAL HYSTERECTOMY     ELBOW SURGERY     EXTRACORPOREAL SHOCK WAVE LITHOTRIPSY Right 07/11/2019   Procedure: EXTRACORPOREAL SHOCK WAVE LITHOTRIPSY (ESWL);  Surgeon: Devere Lonni Righter, MD;  Location: [facility];  Service: Urology;  Laterality: Right;    EXTRACORPOREAL SHOCK WAVE LITHOTRIPSY Left 08/01/2019   Procedure: LEFT EXTRACORPOREAL SHOCK WAVE LITHOTRIPSY (ESWL);  Surgeon: Nieves Cough, MD;  Location: [facility];  Service: Urology;  Laterality: Left;   KNEE SURGERY     LAPAROSCOPIC GASTRIC BANDING     LITHOTRIPSY       Current Meds  Medication Sig   Cholecalciferol (VITAMIN D) 50 MCG (2000 UT) CAPS Take 2,000 Units by mouth daily.   DULoxetine  (CYMBALTA ) 30 MG capsule Take 90 mg by mouth daily.   famotidine  (PEPCID ) 20 MG tablet Take 20 mg by mouth at bedtime.   losartan  (COZAAR ) 25 MG tablet Take 25 mg by mouth daily.   magnesium oxide (MAG-OX) 400 MG tablet Take 400 mg by mouth daily.    meclizine  (ANTIVERT ) 25 MG tablet Take 1 tablet (25 mg total) by mouth 3 (three) times daily as needed for dizziness.   methenamine  (HIPREX ) 1 g tablet Take 1 g by mouth 2 (two) times daily.   Multiple Vitamin (MULTIVITAMIN) tablet Take 1 tablet by mouth daily.     oxyCODONE-acetaminophen  (PERCOCET) 10-325 MG tablet Take 1 tablet by mouth 4 (four) times daily as needed.   pantoprazole  (PROTONIX ) 40 MG tablet Take 40 mg by mouth 2 (two) times daily.   simvastatin  (ZOCOR ) 20  MG tablet Take 20 mg by mouth daily.   sodium chloride  (OCEAN) 0.65 % SOLN nasal spray Place 1 spray into both nostrils as needed for congestion.   vitamin B-12 (CYANOCOBALAMIN ) 1000 MCG tablet Take 1,000 mcg by mouth daily.     Allergies:   Shingrix [zoster vac recomb adjuvanted]   Social History   Tobacco Use   Smoking status: Never   Smokeless tobacco: Never  Substance Use Topics   Alcohol use: No   Drug use: No     Family Hx: The patient's family history includes CAD in her brother and father; Stroke in her mother.  ROS:   Please see the history of present illness.     All other systems reviewed and are negative.   Labs/Other Tests and Data Reviewed:    Recent Labs: No results found for requested labs within last 365 days.   Recent  Lipid Panel No results found for: CHOL, TRIG, HDL, CHOLHDL, LDLCALC, LDLDIRECT  Wt Readings from Last 3 Encounters:  10/13/23 166 lb 3.2 oz (75.4 kg)  12/13/21 166 lb 9.6 oz (75.6 kg)  12/02/21 167 lb 12.3 oz (76.1 kg)     Objective:    Vital Signs:  BP 116/72   Pulse 61   Ht 5' 5 (1.651 m)   Wt 166 lb 3.2 oz (75.4 kg)   SpO2 98%   BMI 27.66 kg/m   Well nourished, well developed female in no acute distress. Well appearing, alert and conversant, regular work of breathing,  good skin color  Eyes- anicteric mouth- oral mucosa is pink  neuro- grossly intact skin- no apparent rash or lesions or cyanosis   ASSESSMENT & PLAN:    OSA - The patient is tolerating PAP therapy well without any problems. The PAP download performed by his DME was personally reviewed and interpreted by me today and showed an AHI of 5.4 /hr on 15 cm H2O with 93% compliance in using more than 4 hours nightly.  The patient has been using and benefiting from PAP use and will continue to benefit from therapy.  -she is having AM HAs which may be due to ongoing respiratory events -her AHI is 5.4/hr and so I will increase pressure to 16cm H2O.  -she is due for a new PAP device so I will order her a new ResMed CPAP at 16cm H2O with heated humidity and mask of choice  -she has mouth dryness so I will order a new chin strap since hers is old and likely not keeping her mouth closed   HTN - BP controlled on exam today - Continue losartan  25 mg daily with as needed refills  Bruxism -I have recommended that she see her dentist for an oral guard.    Medication Adjustments/Labs and Tests Ordered: Current medicines are reviewed at length with the patient today.  Concerns regarding medicines are outlined above.  Tests Ordered: No orders of the defined types were placed in this encounter.   Medication Changes: No orders of the defined types were placed in this encounter.    Disposition:  Follow up 6  weeks after getting her new device  Signed, Wilbert Bihari, MD  10/13/2023 8:25 AM    [facility]

## 2023-10-23 ENCOUNTER — Telehealth: Payer: Self-pay | Admitting: *Deleted

## 2023-10-23 DIAGNOSIS — G4733 Obstructive sleep apnea (adult) (pediatric): Secondary | ICD-10-CM

## 2023-10-23 NOTE — Telephone Encounter (Signed)
-----   Message from Wilbert Bihari sent at 10/13/2023  8:26 AM EDT ----- order her a new ResMed CPAP at 16cm H2O with heated humidity and mask of choice as well as a new chin strap.

## 2023-10-23 NOTE — Telephone Encounter (Signed)
 Per Dr Shlomo, order her a new ResMed CPAP at 16cm H2O with heated humidity and mask of choice as well as a new chin strap.   Order placed to [facility] via [employer].

## 2023-11-10 DIAGNOSIS — G4733 Obstructive sleep apnea (adult) (pediatric): Secondary | ICD-10-CM | POA: Diagnosis not present

## 2023-11-13 DIAGNOSIS — G4733 Obstructive sleep apnea (adult) (pediatric): Secondary | ICD-10-CM | POA: Diagnosis not present

## 2023-11-17 DIAGNOSIS — Z1231 Encounter for screening mammogram for malignant neoplasm of breast: Secondary | ICD-10-CM | POA: Diagnosis not present

## 2023-12-25 DIAGNOSIS — Z79899 Other long term (current) drug therapy: Secondary | ICD-10-CM | POA: Diagnosis not present

## 2023-12-25 DIAGNOSIS — M5030 Other cervical disc degeneration, unspecified cervical region: Secondary | ICD-10-CM | POA: Diagnosis not present

## 2023-12-25 DIAGNOSIS — Z5181 Encounter for therapeutic drug level monitoring: Secondary | ICD-10-CM | POA: Diagnosis not present

## 2023-12-25 DIAGNOSIS — M51369 Other intervertebral disc degeneration, lumbar region without mention of lumbar back pain or lower extremity pain: Secondary | ICD-10-CM | POA: Diagnosis not present

## 2023-12-27 ENCOUNTER — Other Ambulatory Visit (HOSPITAL_BASED_OUTPATIENT_CLINIC_OR_DEPARTMENT_OTHER): Payer: Self-pay | Admitting: Family Medicine

## 2023-12-27 DIAGNOSIS — L989 Disorder of the skin and subcutaneous tissue, unspecified: Secondary | ICD-10-CM | POA: Diagnosis not present

## 2023-12-27 DIAGNOSIS — K2100 Gastro-esophageal reflux disease with esophagitis, without bleeding: Secondary | ICD-10-CM | POA: Diagnosis not present

## 2023-12-27 DIAGNOSIS — E785 Hyperlipidemia, unspecified: Secondary | ICD-10-CM | POA: Diagnosis not present

## 2023-12-27 DIAGNOSIS — G4733 Obstructive sleep apnea (adult) (pediatric): Secondary | ICD-10-CM | POA: Diagnosis not present

## 2023-12-27 DIAGNOSIS — M549 Dorsalgia, unspecified: Secondary | ICD-10-CM | POA: Diagnosis not present

## 2023-12-27 DIAGNOSIS — F419 Anxiety disorder, unspecified: Secondary | ICD-10-CM | POA: Diagnosis not present

## 2023-12-27 DIAGNOSIS — Z23 Encounter for immunization: Secondary | ICD-10-CM | POA: Diagnosis not present

## 2023-12-27 DIAGNOSIS — R1314 Dysphagia, pharyngoesophageal phase: Secondary | ICD-10-CM | POA: Diagnosis not present

## 2023-12-27 DIAGNOSIS — Z8249 Family history of ischemic heart disease and other diseases of the circulatory system: Secondary | ICD-10-CM | POA: Diagnosis not present

## 2023-12-27 DIAGNOSIS — R7303 Prediabetes: Secondary | ICD-10-CM | POA: Diagnosis not present

## 2023-12-27 DIAGNOSIS — D649 Anemia, unspecified: Secondary | ICD-10-CM | POA: Diagnosis not present

## 2023-12-27 DIAGNOSIS — I10 Essential (primary) hypertension: Secondary | ICD-10-CM | POA: Diagnosis not present

## 2023-12-28 ENCOUNTER — Ambulatory Visit: Admitting: Cardiology

## 2024-01-01 ENCOUNTER — Telehealth: Payer: Self-pay

## 2024-01-01 NOTE — Telephone Encounter (Signed)
 SABRA

## 2024-01-01 NOTE — Progress Notes (Unsigned)
 Virtual Visit via Video Note   Because of Tracey Powell's co-morbid illnesses, she is at least at moderate risk for complications without adequate follow up.  This format is felt to be most appropriate for this patient at this time.  All issues noted in this document were discussed and addressed.  A limited physical exam was performed with this format.  Please refer to the patient's chart for her consent to telehealth for [facility].       Date:  01/02/2024   ID:  Tracey Powell, DOB 05/04/1960, MRN [id number] The patient was identified using 2 identifiers.  Patient Location: Home Provider Location: Home Office   PCP:  [name], MD   [facility] Providers Cardiologist:  Wilbert Bihari, MD     Evaluation Performed:  Follow-Up Visit  Chief Complaint:  OSA  History of Present Illness:    Tracey Powell is a 63 y.o. female with a hx of mild OSA with an AHI of 13/hr and has been on CPAP at 33cm H2O.   At last OV her CPAP was increased to 16cm H2O and a new CPAP device was ordered.  She is now back for followup per insurance requirements to document compliance.   She is doing well with her PAP device and thinks that she has gotten used to it.  She tolerates the nasal mask with chin strap and feels the pressure is adequate.  Since going on PAP she feels rested in the am and has no significant daytime sleepiness.  She denies any significant  nasal dryness or nasal congestion.  She does not think that she snores.     Past Medical History:  Diagnosis Date   Acid reflux    Anxiety    Chronic back pain    Complication of anesthesia    slow to wake up   Depressed    DJD (degenerative joint disease)    Hypercholesteremia    Hypertension    Kidney infection    Nephrolithiasis    OSA (obstructive sleep apnea)    mild with AHI 13/hr now on CPAP at 11cm H2O   Urolithiasis    Past Surgical History:  Procedure Laterality Date   ABDOMINAL HYSTERECTOMY      ELBOW SURGERY     EXTRACORPOREAL SHOCK WAVE LITHOTRIPSY Right 07/11/2019   Procedure: EXTRACORPOREAL SHOCK WAVE LITHOTRIPSY (ESWL);  Surgeon: Devere Lonni Righter, MD;  Location: [facility];  Service: Urology;  Laterality: Right;   EXTRACORPOREAL SHOCK WAVE LITHOTRIPSY Left 08/01/2019   Procedure: LEFT EXTRACORPOREAL SHOCK WAVE LITHOTRIPSY (ESWL);  Surgeon: Nieves Cough, MD;  Location: [facility];  Service: Urology;  Laterality: Left;   KNEE SURGERY     LAPAROSCOPIC GASTRIC BANDING     LITHOTRIPSY       No outpatient medications have been marked as taking for the 01/02/24 encounter (Video Visit) with Bihari Wilbert SAUNDERS, MD.     Allergies:   Shingrix [zoster vac recomb adjuvanted]   Social History   Tobacco Use   Smoking status: Never   Smokeless tobacco: Never  Substance Use Topics   Alcohol use: No   Drug use: No     Family Hx: The patient's family history includes CAD in her brother and father; Stroke in her mother.  ROS:   Please see the history of present illness.     All other systems reviewed and are negative.   Prior Sleep studies:   The following studies  were reviewed today:  PAP compliance download  Labs/Other Tests and Data Reviewed:     Recent Labs: No results found for requested labs within last 365 days.   Wt Readings from Last 3 Encounters:  01/02/24 164 lb (74.4 kg)  10/13/23 166 lb 3.2 oz (75.4 kg)  12/13/21 166 lb 9.6 oz (75.6 kg)     Risk Assessment/Calculations:      Objective:    Vital Signs:  BP 118/84   Pulse 66   Ht 5' 5 (1.651 m)   Wt 164 lb (74.4 kg)   BMI 27.29 kg/m    VITAL SIGNS:  reviewed GEN:  no acute distress EYES:  sclerae anicteric, EOMI - Extraocular Movements Intact RESPIRATORY:  normal respiratory effort, symmetric expansion CARDIOVASCULAR:  no peripheral edema SKIN:  no rash, lesions or ulcers. MUSCULOSKELETAL:  no obvious deformities. NEURO:  alert and oriented x 3, no  obvious focal deficit PSYCH:  normal affect  ASSESSMENT & PLAN:    OSA - The patient is tolerating PAP therapy well without any problems. The PAP download performed by his DME was personally reviewed and interpreted by me today and showed an AHI of 0.5/hr on 15 cm H2O with 97% compliance in using more than 4 hours nightly.  The patient has been using and benefiting from PAP use and will continue to benefit from therapy.  -I encouraged her to place a humidifier in her bedroom to prevent water from running out in her chamber  HTN -BP controlled on exam today -continue Losartan  25mg  daily with PRN refills    Total time of encounter: 20 minutes total time of encounter, including 15 minutes spent in face-to-face patient care on the date of this encounter. This time includes coordination of care and counseling regarding above mentioned problem list. Remainder of non-face-to-face time involved reviewing chart documents/testing relevant to the patient encounter and documentation in the medical record. I have independently reviewed documentation from referring provider.    Medication Adjustments/Labs and Tests Ordered: Current medicines are reviewed at length with the patient today.  Concerns regarding medicines are outlined above.   Tests Ordered: No orders of the defined types were placed in this encounter.   Medication Changes: No orders of the defined types were placed in this encounter.   Follow Up:  In Person in 1 year(s)  Signed, Wilbert Bihari, MD  01/02/2024 8:21 AM    [facility]

## 2024-01-02 ENCOUNTER — Ambulatory Visit: Attending: Cardiology | Admitting: Cardiology

## 2024-01-02 VITALS — BP 118/84 | HR 66 | Ht 65.0 in | Wt 164.0 lb

## 2024-01-02 DIAGNOSIS — I10 Essential (primary) hypertension: Secondary | ICD-10-CM

## 2024-01-02 DIAGNOSIS — G4733 Obstructive sleep apnea (adult) (pediatric): Secondary | ICD-10-CM

## 2024-01-02 NOTE — Patient Instructions (Signed)
 Tracey Powell

## 2024-01-09 ENCOUNTER — Ambulatory Visit (HOSPITAL_BASED_OUTPATIENT_CLINIC_OR_DEPARTMENT_OTHER)
Admission: RE | Admit: 2024-01-09 | Discharge: 2024-01-09 | Disposition: A | Discharge status: Home / Self Care | Payer: Self-pay | Source: Ambulatory Visit | Attending: Family Medicine | Admitting: Family Medicine

## 2024-01-09 DIAGNOSIS — Z8249 Family history of ischemic heart disease and other diseases of the circulatory system: Secondary | ICD-10-CM

## 2024-01-10 DIAGNOSIS — G4733 Obstructive sleep apnea (adult) (pediatric): Secondary | ICD-10-CM | POA: Diagnosis not present

## 2024-01-19 ENCOUNTER — Other Ambulatory Visit (HOSPITAL_BASED_OUTPATIENT_CLINIC_OR_DEPARTMENT_OTHER)

## 2024-01-19 ENCOUNTER — Telehealth: Payer: Self-pay | Admitting: Cardiology

## 2024-01-19 NOTE — Telephone Encounter (Signed)
 Patient called stating she received a letter from [employer] the company that supplies her CPAP stating sleep study, a visit with her doctor and a diagnosis of sleep apnea. She needs this done before medicare take effect 02/05/24.  However she states she still has her other insurance until 03/06/24.

## 2024-02-09 DIAGNOSIS — G4733 Obstructive sleep apnea (adult) (pediatric): Secondary | ICD-10-CM | POA: Diagnosis not present
